# Patient Record
Sex: Female | Born: 1960
Health system: Southern US, Community
[De-identification: ages and names within clinical notes are randomized; demographics above are authoritative.]

## PROBLEM LIST (undated history)

## (undated) DIAGNOSIS — B373 Candidiasis of vulva and vagina: Secondary | ICD-10-CM

## (undated) DIAGNOSIS — R309 Painful micturition, unspecified: Secondary | ICD-10-CM

## (undated) DIAGNOSIS — R319 Hematuria, unspecified: Secondary | ICD-10-CM

## (undated) DIAGNOSIS — Z780 Asymptomatic menopausal state: Secondary | ICD-10-CM

## (undated) DIAGNOSIS — D219 Benign neoplasm of connective and other soft tissue, unspecified: Secondary | ICD-10-CM

## (undated) DIAGNOSIS — N898 Other specified noninflammatory disorders of vagina: Secondary | ICD-10-CM

## (undated) DIAGNOSIS — N760 Acute vaginitis: Secondary | ICD-10-CM

## (undated) DIAGNOSIS — I10 Essential (primary) hypertension: Secondary | ICD-10-CM

## (undated) DIAGNOSIS — B9689 Other specified bacterial agents as the cause of diseases classified elsewhere: Secondary | ICD-10-CM

## (undated) DIAGNOSIS — N816 Rectocele: Secondary | ICD-10-CM

## (undated) HISTORY — DX: Essential (primary) hypertension: I10

## (undated) HISTORY — DX: Painful micturition, unspecified: R30.9

## (undated) HISTORY — DX: Other specified bacterial agents as the cause of diseases classified elsewhere: B96.89

## (undated) HISTORY — DX: Other specified noninflammatory disorders of vagina: N89.8

## (undated) HISTORY — DX: Benign neoplasm of connective and other soft tissue, unspecified: D21.9

## (undated) HISTORY — DX: Other specified bacterial agents as the cause of diseases classified elsewhere: N76.0

## (undated) HISTORY — DX: Rectocele: N81.6

## (undated) HISTORY — PX: OTHER SURGICAL HISTORY: SHX169

## (undated) HISTORY — PX: NO PAST SURGERIES: SHX2092

## (undated) HISTORY — DX: Asymptomatic menopausal state: Z78.0

## (undated) HISTORY — DX: Candidiasis of vulva and vagina: B37.3

## (undated) HISTORY — DX: Hematuria, unspecified: R31.9

---

## 2001-10-12 ENCOUNTER — Other Ambulatory Visit: Admission: RE | Admit: 2001-10-12 | Discharge: 2001-10-12 | Payer: Self-pay | Admitting: Family Medicine

## 2001-11-09 ENCOUNTER — Ambulatory Visit (HOSPITAL_COMMUNITY): Admission: RE | Admit: 2001-11-09 | Discharge: 2001-11-09 | Payer: Self-pay | Admitting: Family Medicine

## 2001-11-09 ENCOUNTER — Encounter: Payer: Self-pay | Admitting: Family Medicine

## 2002-11-09 ENCOUNTER — Encounter: Payer: Self-pay | Admitting: Family Medicine

## 2002-11-09 ENCOUNTER — Ambulatory Visit (HOSPITAL_COMMUNITY): Admission: RE | Admit: 2002-11-09 | Discharge: 2002-11-09 | Payer: Self-pay | Admitting: Family Medicine

## 2003-09-30 ENCOUNTER — Emergency Department (HOSPITAL_COMMUNITY): Admission: EM | Admit: 2003-09-30 | Discharge: 2003-09-30 | Payer: Self-pay | Admitting: Emergency Medicine

## 2003-11-14 ENCOUNTER — Ambulatory Visit (HOSPITAL_COMMUNITY): Admission: RE | Admit: 2003-11-14 | Discharge: 2003-11-14 | Payer: Self-pay | Admitting: Family Medicine

## 2004-11-20 ENCOUNTER — Ambulatory Visit (HOSPITAL_COMMUNITY): Admission: RE | Admit: 2004-11-20 | Discharge: 2004-11-20 | Payer: Self-pay | Admitting: Family Medicine

## 2005-11-26 ENCOUNTER — Ambulatory Visit (HOSPITAL_COMMUNITY): Admission: RE | Admit: 2005-11-26 | Discharge: 2005-11-26 | Payer: Self-pay | Admitting: Family Medicine

## 2006-07-24 ENCOUNTER — Ambulatory Visit: Payer: Self-pay | Admitting: Internal Medicine

## 2006-07-24 LAB — CONVERTED CEMR LAB
BUN: 11 mg/dL
Calcium: 9.3 mg/dL
Cholesterol: 150 mg/dL
Glucose, Bld: 75 mg/dL
LDL Cholesterol: 67 mg/dL
Sodium: 138 meq/L
Triglycerides: 112 mg/dL

## 2006-09-25 ENCOUNTER — Ambulatory Visit: Payer: Self-pay | Admitting: Internal Medicine

## 2006-09-25 DIAGNOSIS — I10 Essential (primary) hypertension: Secondary | ICD-10-CM | POA: Insufficient documentation

## 2006-12-09 ENCOUNTER — Ambulatory Visit (HOSPITAL_COMMUNITY): Admission: RE | Admit: 2006-12-09 | Discharge: 2006-12-09 | Payer: Self-pay | Admitting: Obstetrics and Gynecology

## 2007-01-27 ENCOUNTER — Ambulatory Visit: Payer: Self-pay | Admitting: Internal Medicine

## 2007-01-27 DIAGNOSIS — M549 Dorsalgia, unspecified: Secondary | ICD-10-CM | POA: Insufficient documentation

## 2007-06-19 ENCOUNTER — Ambulatory Visit: Payer: Self-pay | Admitting: Internal Medicine

## 2007-06-19 DIAGNOSIS — E663 Overweight: Secondary | ICD-10-CM | POA: Insufficient documentation

## 2007-12-09 ENCOUNTER — Other Ambulatory Visit: Admission: RE | Admit: 2007-12-09 | Discharge: 2007-12-09 | Payer: Self-pay | Admitting: Obstetrics and Gynecology

## 2007-12-14 ENCOUNTER — Ambulatory Visit (HOSPITAL_COMMUNITY): Admission: RE | Admit: 2007-12-14 | Discharge: 2007-12-14 | Payer: Self-pay | Admitting: Obstetrics and Gynecology

## 2008-05-13 ENCOUNTER — Ambulatory Visit: Payer: Self-pay | Admitting: Internal Medicine

## 2008-05-13 DIAGNOSIS — R51 Headache: Secondary | ICD-10-CM | POA: Insufficient documentation

## 2008-05-13 DIAGNOSIS — R519 Headache, unspecified: Secondary | ICD-10-CM | POA: Insufficient documentation

## 2008-09-29 ENCOUNTER — Ambulatory Visit (HOSPITAL_COMMUNITY): Admission: RE | Admit: 2008-09-29 | Discharge: 2008-09-29 | Payer: Self-pay | Admitting: Obstetrics & Gynecology

## 2008-11-18 HISTORY — PX: COLONOSCOPY: SHX174

## 2008-12-14 ENCOUNTER — Ambulatory Visit (HOSPITAL_COMMUNITY): Admission: RE | Admit: 2008-12-14 | Discharge: 2008-12-14 | Payer: Self-pay | Admitting: Obstetrics and Gynecology

## 2008-12-28 ENCOUNTER — Other Ambulatory Visit: Admission: RE | Admit: 2008-12-28 | Discharge: 2008-12-28 | Payer: Self-pay | Admitting: Obstetrics and Gynecology

## 2009-03-13 ENCOUNTER — Ambulatory Visit: Payer: Self-pay | Admitting: Family Medicine

## 2009-03-13 DIAGNOSIS — N949 Unspecified condition associated with female genital organs and menstrual cycle: Secondary | ICD-10-CM

## 2009-03-13 DIAGNOSIS — N938 Other specified abnormal uterine and vaginal bleeding: Secondary | ICD-10-CM | POA: Insufficient documentation

## 2009-03-14 ENCOUNTER — Encounter (INDEPENDENT_AMBULATORY_CARE_PROVIDER_SITE_OTHER): Payer: Self-pay | Admitting: *Deleted

## 2009-04-18 ENCOUNTER — Ambulatory Visit: Payer: Self-pay | Admitting: Gastroenterology

## 2009-04-18 DIAGNOSIS — K5909 Other constipation: Secondary | ICD-10-CM | POA: Insufficient documentation

## 2009-04-20 ENCOUNTER — Encounter: Payer: Self-pay | Admitting: Gastroenterology

## 2009-05-15 ENCOUNTER — Ambulatory Visit (HOSPITAL_COMMUNITY): Admission: RE | Admit: 2009-05-15 | Discharge: 2009-05-15 | Payer: Self-pay | Admitting: Gastroenterology

## 2009-05-15 ENCOUNTER — Ambulatory Visit: Payer: Self-pay | Admitting: Gastroenterology

## 2009-05-25 ENCOUNTER — Ambulatory Visit: Payer: Self-pay | Admitting: Family Medicine

## 2009-07-17 ENCOUNTER — Telehealth: Payer: Self-pay | Admitting: Family Medicine

## 2009-07-29 ENCOUNTER — Encounter: Payer: Self-pay | Admitting: Family Medicine

## 2009-07-29 LAB — CONVERTED CEMR LAB
BUN: 18 mg/dL (ref 6–23)
CO2: 23 meq/L (ref 19–32)
Chloride: 103 meq/L (ref 96–112)
Creatinine, Ser: 0.8 mg/dL (ref 0.40–1.20)
Eosinophils Absolute: 0.1 10*3/uL (ref 0.0–0.7)
Eosinophils Relative: 1 % (ref 0–5)
Glucose, Bld: 89 mg/dL (ref 70–99)
HCT: 39 % (ref 36.0–46.0)
HDL: 49 mg/dL (ref >39)
LDL Cholesterol: 90 mg/dL (ref 0–99)
Lymphs Abs: 2.1 10*3/uL (ref 0.7–4.0)
MCHC: 34.6 g/dL (ref 30.0–36.0)
MCV: 95.8 fL (ref 78.0–100.0)
Monocytes Absolute: 0.6 10*3/uL (ref 0.1–1.0)
Monocytes Relative: 11 % (ref 3–12)
RBC: 4.07 M/uL (ref 3.87–5.11)
TSH: 4.198 microintl units/mL (ref 0.350–4.500)
WBC: 5 10*3/uL (ref 4.0–10.5)

## 2009-08-08 ENCOUNTER — Ambulatory Visit: Payer: Self-pay | Admitting: Family Medicine

## 2009-08-21 ENCOUNTER — Encounter: Payer: Self-pay | Admitting: Orthopedic Surgery

## 2009-08-28 ENCOUNTER — Ambulatory Visit: Payer: Self-pay | Admitting: Orthopedic Surgery

## 2009-12-15 ENCOUNTER — Ambulatory Visit (HOSPITAL_COMMUNITY): Admission: RE | Admit: 2009-12-15 | Discharge: 2009-12-15 | Payer: Self-pay | Admitting: Family Medicine

## 2010-01-22 ENCOUNTER — Other Ambulatory Visit: Admission: RE | Admit: 2010-01-22 | Discharge: 2010-01-22 | Payer: Self-pay | Admitting: Obstetrics and Gynecology

## 2010-01-23 ENCOUNTER — Ambulatory Visit: Payer: Self-pay | Admitting: Family Medicine

## 2010-02-21 ENCOUNTER — Telehealth: Payer: Self-pay | Admitting: Family Medicine

## 2010-05-25 ENCOUNTER — Telehealth: Payer: Self-pay | Admitting: Family Medicine

## 2010-09-13 ENCOUNTER — Ambulatory Visit (HOSPITAL_COMMUNITY)
Admission: RE | Admit: 2010-09-13 | Discharge: 2010-09-13 | Payer: Self-pay | Source: Home / Self Care | Admitting: Obstetrics and Gynecology

## 2010-10-23 ENCOUNTER — Telehealth: Payer: Self-pay | Admitting: Family Medicine

## 2010-11-06 ENCOUNTER — Ambulatory Visit: Payer: Self-pay | Admitting: Family Medicine

## 2010-11-12 DIAGNOSIS — B369 Superficial mycosis, unspecified: Secondary | ICD-10-CM | POA: Insufficient documentation

## 2010-11-26 ENCOUNTER — Encounter: Payer: Self-pay | Admitting: Family Medicine

## 2010-11-27 DIAGNOSIS — E559 Vitamin D deficiency, unspecified: Secondary | ICD-10-CM | POA: Insufficient documentation

## 2010-11-27 LAB — CONVERTED CEMR LAB
Basophils Absolute: 0 10*3/uL (ref 0.0–0.1)
CO2: 25 meq/L (ref 19–32)
Chloride: 106 meq/L (ref 96–112)
Creatinine, Ser: 0.8 mg/dL (ref 0.40–1.20)
HDL: 49 mg/dL (ref >39)
Hemoglobin: 13 g/dL (ref 12.0–15.0)
LDL Cholesterol: 94 mg/dL (ref 0–99)
Lymphocytes Relative: 32 % (ref 12–46)
Monocytes Absolute: 0.6 10*3/uL (ref 0.1–1.0)
Neutro Abs: 4.2 10*3/uL (ref 1.7–7.7)
RBC: 4.01 M/uL (ref 3.87–5.11)
RDW: 12 % (ref 11.5–15.5)
TSH: 3.644 microintl units/mL (ref 0.350–4.500)
Total CHOL/HDL Ratio: 3.2
WBC: 7.2 10*3/uL (ref 4.0–10.5)

## 2010-12-17 ENCOUNTER — Ambulatory Visit (HOSPITAL_COMMUNITY)
Admission: RE | Admit: 2010-12-17 | Discharge: 2010-12-17 | Payer: Self-pay | Source: Home / Self Care | Attending: Family Medicine | Admitting: Family Medicine

## 2010-12-18 NOTE — Assessment & Plan Note (Signed)
Summary: office visit   Vital Signs:  Patient profile:   50 year old female Menstrual status:  regular Height:      65.5 inches Weight:      172.13 pounds BMI:     28.31 O2 Sat:      96 % Pulse rate:   62 / minute Pulse rhythm:   regular Resp:     16 per minute BP sitting:   118 / 80  (left arm) Cuff size:   large  Vitals Entered By: Everitt Amber LPN (January 24, 8468 9:24 AM)  Nutrition Counseling: Patient's BMI is greater than 25 and therefore counseled on weight management options. CC: Follow up chronic problems Is Patient Diabetic? No   Primary Care Provider:  Callie Bunyard,M.D.  CC:  Follow up chronic problems.  History of Present Illness: Reports  that she has been  doing well.Shwe ois concerned about her weight and states shereally has not been exercising as regularly as she should be becauseof time constraints, she intends however to change this. Denies recent fever or chills. Denies sinus pressure, nasal congestion , ear pain or sore throat. Denies chest congestion, or cough productive of sputum. Denies chest pain, palpitations, PND, orthopnea or leg swelling. Denies abdominal pain, nausea, vomitting, diarrhea or constipation. Denies change in bowel movements or bloody stool. Denies dysuria , frequency, incontinence or hesitancy. Denies  joint pain, swelling, or reduced mobility. Denies headaches, vertigo, seizures. Denies depression, anxiety or insomnia. Denies  rash, lesions, or itch.     Current Medications (verified): 1)  Loestrin Fe 1.5/30 1.5-30 Mg-Mcg Tabs (Norethin Ace-Eth Estrad-Fe) .... Take 1 Tablet By Mouth Once A Day 2)  Tylenol 325 Mg Tabs (Acetaminophen) .... As Needed 3)  Maxzide-25 37.5-25 Mg Tabs (Triamterene-Hctz) .... Take 1 Tablet By Mouth Once A Day  Allergies (verified): 1)  ! Amoxicillin 2)  ! Advil 3)  ! Ibuprofen  Past History:  Past medical, surgical, family and social histories (including risk factors) reviewed, and no changes noted  (except as noted below).  Past Medical History: Hypertension(diet controlled)  2001  overweight in the past 18 months, associaters it with shift change  Past Surgical History:  Ganglion cyst removed from right hand 2010, dr Romeo Apple  Family History: Reviewed history from 08/28/2009 and no changes required. No FH of Colon Cancer or polyps. father-deceased-MI age of first late 29's mother-deceased-CVA age 58 sister x1, hTN diagnosed in her 70's ,currently aged 94 brothers x3 one is hypertensive FH of Cancer:  Family History of Diabetes  Social History: Reviewed history from 08/28/2009 and no changes required. 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 Regular exercise-tries to walk 15 mins a day, longer if possible. No children  Review of Systems      See HPI Eyes:  Denies blurring and discharge. Neuro:  Denies headaches and tingling. Endo:  Denies cold intolerance, excessive hunger, excessive thirst, excessive urination, heat intolerance, polyuria, and weight change. Heme:  Denies abnormal bruising and bleeding. Allergy:  Denies hives or rash and sneezing.  Physical Exam  General:  alert, well-nourished, and well-hydrated. HEENT: No facial asymmetry,  EOMI, No sinus tenderness, TM's Clear, oropharynx  pink and moist.   Chest: Clear to auscultation bilaterally.  CVS: S1, S2, No murmurs, No S3.   Abd: Soft, Nontender.  MS: Adequate ROM spine, hips, shoulders and knees.  Ext: No edema.   CNS: CN 2-12 intact, power tone and sensation normal throughout.   Skin: Intact, no visible lesions or rashes. Ganglion cyst tender on right hand (dorsum) Psych: Good eye contact, normal affect.  Memory intact, not anxious or depressed appearing.      Impression & Recommendations:  Problem # 1:  OVERWEIGHT (ICD-278.02) Assessment  Deteriorated  Orders: T-Lipid Profile ([id number])  Ht: 65.5 (01/23/2010)   Wt: 172.13 (01/23/2010)   BMI: 28.31 (01/23/2010)  Problem # 2:  HYPERTENSION (ICD-401.9) Assessment: Unchanged  Her updated medication list for this problem includes:    Maxzide-25 37.5-25 Mg Tabs (Triamterene-hctz) .Marland Kitchen... Take 1 tablet by mouth once a day  Orders: T-Basic Metabolic Panel [id number])  BP today: 118/80 Prior BP: 106/80 (08/08/2009)  Labs Reviewed: K+: 3.9 (07/29/2009) Creat: : 0.80 (07/29/2009)   Chol: 151 (07/29/2009)   HDL: 49 (07/29/2009)   LDL: 90 (07/29/2009)   TG: 62 (07/29/2009)  Complete Medication List: 1)  Loestrin Fe 1.5/30 1.5-30 Mg-mcg Tabs (Norethin ace-eth estrad-fe) .... Take 1 tablet by mouth once a day 2)  Maxzide-25 37.5-25 Mg Tabs (Triamterene-hctz) .... Take 1 tablet by mouth once a day  Other Orders: T-CBC w/Diff ([id number]) T-Vitamin D (25-Hydroxy) [id number])  Patient Instructions: 1)  f/U in  Sept 15 or after  2)  It is important that you exercise regularly at least 20 minutes 5 times a week. If you develop chest pain, have severe difficulty breathing, or feel very tired , stop exercising immediately and seek medical attention. 3)  You need to lose weight. Consider a lower calorie diet and regular exercise.  4)  no med changes at this time. 5)  Congrats on upcoming marriage 6)  BMP prior to visit, ICD-9: 7)  Lipid Panel prior to visit, ICD-9: 8)  CBC w/ Diff prior to visit, ICD-9:  fasting sept 12 or after. 9)  Vit D level  Prescriptions: MAXZIDE-25 37.5-25 MG TABS (TRIAMTERENE-HCTZ) Take 1 tablet by mouth once a day  #30 x 5   Entered by:   Everitt Amber LPN   Authorized by:   Syliva Overman MD   Signed by:   Everitt Amber LPN on 28/41/3244   Method used:   Electronically to        [facility] (retail)       [address]       [place], [place]  [address]       Ph: [phone]       Fax: [phone]   RxID:    [id number]

## 2010-12-18 NOTE — Assessment & Plan Note (Signed)
Summary: Headache   Vital Signs:  Patient Profile:   50 Years Old Female Height:     66 inches O2 Sat:      100 % O2 treatment:    Room Air Pulse rate:   72 / minute Resp:     8 per minute BP sitting:   134 / 92  (right arm)  Vitals Entered By: Lutricia Horsfall (May 13, 2008 2:42 PM)                 Chief Complaint:  headaches x1 month.  History of Present Illness: Her complaining of headaches 2-3 times per week in the bilateral parietal area associated with blurry vision.  No nausea or vomiting.  She denies any aura.  She denies a history of headaches.  She has tried garlic tablets because she heard that was good for her blood pressure and she thought the headaches were related to blood pressure.  SHe denies nasal congestion, post nasal drip, sore throat or ear pain.  No history of trauma.  No numbness or weakness.  She says she checked her blood pressure at the pharmacy last week and it was 129/87 but she was not having a headache then.  She does complain of a 5/10 headache at this time.    Current Allergies: ! AMOXICILLIN  Past Medical History:    Reviewed history from 01/27/2007 and no changes required:       Hypertension(diet controlled)      Physical Exam  General:     alert, well-developed, and well-nourished.   Head:     normocephalic, atraumatic, and no abnormalities palpated.  Mild tenderness only in the b/l temporal area. Nose:     mild Erythema and swelling with clear drainage.  Mouth:     No eyrthema or exudate.  Neck:     supple and full ROM.  No lymphadenopathy, JVD, thyromegaly or carotid bruits.  Lungs:     Clear to auscultation bilaterally with good air movement, normal expansion.  Heart:     Normal S1 and S2. No murmurs or extracardiac sounds. PMI is nondisplaced.     Impression & Recommendations:  Problem # 1:  HEADACHE (ICD-784.0) We discussed possible work up and treatment options.  We are opting to be conservative and use Prodrin  as needed headache.  If she persists in having headaches weekly for the next month, we discussed getting a head CT for further evaluation.  I told her that her blood pressure is more likely mildly elevated today due to the headache and not the cause of the headache.   Her updated medication list for this problem includes:    Tylenol 325 Mg Tabs (Acetaminophen) .Marland Kitchen... As needed    Meloxicam 15 Mg Tabs (Meloxicam) .Marland Kitchen... 1 by mouth once daily    Prodrin 500-130-20 Mg Tabs (Apap-isometheptene-caffeine) .Marland Kitchen... 1 by mouth q 4 hours as needed headache   Complete Medication List: 1)  Nortrel 7/7/7 0.5/0.75/1-35 Mg-mcg Tabs (Norethin-eth estrad triphasic) .Marland Kitchen.. 1 by mouth once daily 2)  Tylenol 325 Mg Tabs (Acetaminophen) .... As needed 3)  Skelaxin 800 Mg Tabs (Metaxalone) .Marland Kitchen.. 1 by mouth three times a day 4)  Meloxicam 15 Mg Tabs (Meloxicam) .Marland Kitchen.. 1 by mouth once daily 5)  Prodrin 500-130-20 Mg Tabs (Apap-isometheptene-caffeine) .Marland Kitchen.. 1 by mouth q 4 hours as needed headache    Prescriptions: PRODRIN 500-130-20 MG  TABS (APAP-ISOMETHEPTENE-CAFFEINE) 1 by mouth q 4 hours as needed headache  #10 x 0   Entered  and Authorized by:   Erle Crocker MD   Signed by:   Erle Crocker MD on 05/13/2008   Method used:   Samples Given   RxID:   [id number]  ][Headache Q&E-CCC]

## 2010-12-18 NOTE — Progress Notes (Signed)
Summary: refill  Phone Note Call from Patient   Summary of Call: pt would like to get a refill on meds. [phone] Initial call taken by: Rudene Anda,  February 21, 2010 10:26 AM  Follow-up for Phone Call        Rx Called In Follow-up by: Adella Hare LPN,  February 21, 2010 11:21 AM    Prescriptions: MAXZIDE-25 37.5-25 MG TABS (TRIAMTERENE-HCTZ) Take 1 tablet by mouth once a day  #30 x 3   Entered by:   Adella Hare LPN   Authorized by:   Syliva Overman MD   Signed by:   Adella Hare LPN on 45/40/9811   Method used:   Electronically to        [facility] (retail)       [address]       [place], [place]  [address]       Ph: [phone]       Fax: [phone]   RxID:   [id number] LOESTRIN FE 1.5/30 1.5-30 MG-MCG TABS (NORETHIN ACE-ETH ESTRAD-FE) Take 1 tablet by mouth once a day  #30 x 3   Entered by:   Adella Hare LPN   Authorized by:   Syliva Overman MD   Signed by:   Adella Hare LPN on 66/44/0347   Method used:   Electronically to        [facility] (retail)       [address]       [place], [place]  [address]       Ph: [phone]       Fax: [phone]   RxID:   [id number]

## 2010-12-18 NOTE — Progress Notes (Signed)
Summary: BP MED  Phone Note Call from Patient   Summary of Call: [facility]  CALL BACK AT [phone] Initial call taken by: Lind Guest,  May 25, 2010 8:50 AM  Follow-up for Phone Call        plsrefill maxzide x 1 , needs ov Follow-up by: Syliva Overman MD,  May 25, 2010 12:46 PM  Additional Follow-up for Phone Call Additional follow up Details #1::        Rx Called In Additional Follow-up by: Adella Hare LPN,  May 26, 8755 3:28 PM    Prescriptions: MAXZIDE-25 37.5-25 MG TABS (TRIAMTERENE-HCTZ) Take 1 tablet by mouth once a day  #30 x 0   Entered by:   Adella Hare LPN   Authorized by:   Syliva Overman MD   Signed by:   Adella Hare LPN on 43/32/9518   Method used:   [facility]        [facility] [phone][facility] (retail)       [address]       [place], [place]  [address]       Ph: [phone] or [phone]       Fax: [phone]   RxID:   [id number] MAXZIDE-25 37.5-25 MG TABS (TRIAMTERENE-HCTZ) Take 1 tablet by mouth once a day  #30 x 0   Entered by:   Adella Hare LPN   Authorized by:   Syliva Overman MD   Signed by:   Adella Hare LPN on 94/85/4627   Method used:   Electronically to        [facility] (retail)       [address]       [place], [place]  [address]       Ph: [phone]       Fax: [phone]   RxID:   [id number]  [facility]

## 2010-12-18 NOTE — Progress Notes (Signed)
Summary: medication  Phone Note Call from Patient   Summary of Call: patient [facility], I requested patient to call pharmacy but she isn't sure which of her medications is for blood pressure and she said it started with an F, not one on her medication list. Initial call taken by: Curtis Sites,  October 23, 2010 9:53 AM  Follow-up for Phone Call        according to when this was last sent in, patient should have been out of meds two months ago patient needs an ov first and needs to bring meds with her Follow-up by: Adella Hare LPN,  October 24, 2010 12:56 PM  Additional Follow-up for Phone Call Additional follow up Details #1::        called and left msg for patient to return my call. Additional Follow-up by: Curtis Sites,  October 24, 2010 2:03 PM    Additional Follow-up for Phone Call Additional follow up Details #2::    called patient, left message Follow-up by: Adella Hare LPN,  October 25, 2010 12:55 PM  Additional Follow-up for Phone Call Additional follow up Details #3:: Details for Additional Follow-up Action Taken: patient advised to schedule appt Additional Follow-up by: Adella Hare LPN,  October 26, 2010 1:25 PM

## 2010-12-20 NOTE — Miscellaneous (Signed)
  Clinical Lists Changes  Medications: Added new medication of VITAMIN D (ERGOCALCIFEROL) 50000 UNIT CAPS (ERGOCALCIFEROL) one capsule once weekly - Signed Rx of VITAMIN D (ERGOCALCIFEROL) 50000 UNIT CAPS (ERGOCALCIFEROL) one capsule once weekly;  #4 x 4;  Signed;  Entered by: Syliva Overman MD;  Authorized by: Syliva Overman MD;  Method used: Historical    Prescriptions: VITAMIN D (ERGOCALCIFEROL) 50000 UNIT CAPS (ERGOCALCIFEROL) one capsule once weekly  #4 x 4   Entered and Authorized by:   Syliva Overman MD   Signed by:   Syliva Overman MD on 11/26/2010   Method used:   Historical   RxID:   [id number]

## 2010-12-20 NOTE — Assessment & Plan Note (Signed)
Summary: OV   Vital Signs:  Patient profile:   50 year old female Menstrual status:  regular Height:      65.5 inches Weight:      179 pounds BMI:     29.44 O2 Sat:      98 % on Room air Pulse rate:   74 / minute Pulse rhythm:   regular Resp:     16 per minute BP sitting:   110 / 70  (left arm)  Vitals Entered By: Adella Hare LPN (November 06, 2010 10:41 AM)  Nutrition Counseling: Patient's BMI is greater than 25 and therefore counseled on weight management options.  O2 Flow:  Room air CC: rash on left hand Is Patient Diabetic? No   Primary Care Daquarius Dubeau:  Simpson,M.D.  CC:  rash on left hand.  History of Present Illness: Reports  that she has been doing well. she is not execising like she had in the past, and has gained weight, she plans to address this.  Denies recent fever or chills. Denies sinus pressure, nasal congestion , ear pain or sore throat. Denies chest congestion, or cough productive of sputum. Denies chest pain, palpitations, PND, orthopnea or leg swelling. Denies abdominal pain, nausea, vomitting, diarrhea or constipation. Denies change in bowel movements or bloody stool. Denies dysuria , frequency, incontinence or hesitancy. Denies  joint pain, swelling, or reduced mobility. Denies headaches, vertigo, seizures. Denies depression, anxiety or insomnia.      Current Medications (verified): 1)  Loestrin Fe 1.5/30 1.5-30 Mg-Mcg Tabs (Norethin Ace-Eth Estrad-Fe) .... Take 1 Tablet By Mouth Once A Day 2)  Maxzide-25 37.5-25 Mg Tabs (Triamterene-Hctz) .... Take 1 Tablet By Mouth Once A Day  Allergies (verified): 1)  ! Amoxicillin 2)  ! Advil 3)  ! Ibuprofen  Past History:  Past surgical history reviewed for relevance to current acute and chronic problems.  Past Surgical History: Reviewed history from 01/23/2010 and no changes required.  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 Regular exercise-tries to walk 15 mins a day, longer if possible. No children  Review of Systems      See HPI Eyes:  Denies discharge, eye pain, and red eye. Derm:  Complains of itching, lesion(s), and rash; dorsum of left hand enlarging x 3 months. Endo:  Denies cold intolerance, excessive hunger, excessive thirst, excessive urination, and heat intolerance. Heme:  Denies abnormal bruising and bleeding. Allergy:  Complains of seasonal allergies; denies hives or rash and itching eyes.  Physical Exam  General:  Well-developed,well-nourished,in no acute distress; alert,appropriate and cooperative throughout examination HEENT: No facial asymmetry,  EOMI, No sinus tenderness, TM's Clear, oropharynx  pink and moist.   Chest: Clear to auscultation bilaterally.  CVS: S1, S2, No murmurs, No S3.   Abd: Soft, Nontender.  MS: Adequate ROM spine, hips, shoulders and knees.  Ext: No edema.   CNS: CN 2-12 intact, power tone and sensation normal throughout.   Skin: Intact,fungal infection on dorsum of hand Psych: Good eye contact, normal affect.  Memory intact, not anxious or depressed appearing.    Impression & Recommendations:  Problem # 1:  OVERWEIGHT (ICD-278.02) Assessment Deteriorated  Ht: 65.5 (11/06/2010)   Wt: 179 (11/06/2010)   BMI: 29.44 (11/06/2010) therapeutic lifestyle change discussed and encouraged  Problem # 2:  HYPERTENSION (ICD-401.9) Assessment: Improved  Her updated medication list for this problem includes:    Maxzide-25 37.5-25 Mg Tabs (Triamterene-hctz) .Marland Kitchen... Take 1 tablet by mouth once a day  BP today: 110/70 Prior BP: 118/80 (01/23/2010)  Labs Reviewed: K+: 3.9 (07/29/2009) Creat: : 0.80 (07/29/2009)   Chol: 151 (07/29/2009)   HDL: 49 (07/29/2009)   LDL: 90 (07/29/2009)   TG: 62  (07/29/2009)  Problem # 3:  DERMATOMYCOSIS (ICD-111.9) Assessment: Comment Only  Her updated medication list for this problem includes:    Clotrimazole-betamethasone 1-0.05 % Crea (Clotrimazole-betamethasone) .Marland Kitchen... Apply to affected area twice daily for 2 weeks , then as needed  Complete Medication List: 1)  Loestrin Fe 1.5/30 1.5-30 Mg-mcg Tabs (Norethin ace-eth estrad-fe) .... Take 1 tablet by mouth once a day 2)  Maxzide-25 37.5-25 Mg Tabs (Triamterene-hctz) .... Take 1 tablet by mouth once a day 3)  Clotrimazole-betamethasone 1-0.05 % Crea (Clotrimazole-betamethasone) .... Apply to affected area twice daily for 2 weeks , then as needed  Other Orders: T-Basic Metabolic Panel [id number]) T-CBC w/Diff [id number]) T-Lipid Profile [id number]) T-TSH [id number]) T-Vitamin D (25-Hydroxy) [id number])  Patient Instructions: 1)  Follow up appointment in 5.77months 2)  It is important that you exercise regularly at least 20 minutes 5 times a week. If you develop chest pain, have severe difficulty breathing, or feel very tired , stop exercising immediately and seek medical attention. 3)  You need to lose weight. Consider a lower calorie diet and regular exercise.aim is 8 to pounds  4)  BMP prior to visit, ICD-9: 5)  Lipid Panel prior to visit, ICD-9: 6)  TSH prior to visit, ICD-9:   fasting asap 7)  CBC w/ Diff prior to visit, ICD-9: 8)  vit D 9)  Schedule your mammogram. 10)  You need to have a Pap Smear to prevent cervical cancer. 11)  Med is sent in for rash which I believe is partly fungal and partly eczema 12)  BOP is great Prescriptions: MAXZIDE-25 37.5-25 MG TABS (TRIAMTERENE-HCTZ) Take 1 tablet by mouth once a day  #30 x 5   Entered by:   Adella Hare LPN   Authorized by:   Syliva Overman MD   Signed by:   Adella Hare LPN on 64/40/3474   Method used:   [facility]        [facility] [phone][facility] (retail)       [address]       [place], [place]  [address]       Ph: [phone] or [phone]       Fax: [phone]   RxID:   [id number] CLOTRIMAZOLE-BETAMETHASONE 1-0.05 % CREA (CLOTRIMAZOLE-BETAMETHASONE) apply to affected area twice daily for 2 weeks , then as needed  #45 gm x 0   Entered and Authorized by:   Syliva Overman MD   Signed by:   Syliva Overman MD on 11/06/2010   Method used:   [facility]        [facility] [phone][facility] (retail)       [address]       [place], [place]  [address]       Ph: [phone] or [phone]       Fax: [phone]   RxID:   [id number]    Orders Added: 1)  Est. Patient Level IV [10258] 2)  T-Basic Metabolic Panel [[id number]] 3)  T-CBC w/Diff [[id number]] 4)  T-Lipid  Profile [[id number]] 5)  T-TSH [[id number]] 6)  T-Vitamin D (25-Hydroxy) [phone]

## 2011-01-30 ENCOUNTER — Encounter: Payer: Self-pay | Admitting: Family Medicine

## 2011-01-30 ENCOUNTER — Ambulatory Visit (INDEPENDENT_AMBULATORY_CARE_PROVIDER_SITE_OTHER): Payer: 59 | Admitting: Family Medicine

## 2011-01-30 DIAGNOSIS — E559 Vitamin D deficiency, unspecified: Secondary | ICD-10-CM

## 2011-01-30 DIAGNOSIS — L259 Unspecified contact dermatitis, unspecified cause: Secondary | ICD-10-CM

## 2011-01-30 DIAGNOSIS — I10 Essential (primary) hypertension: Secondary | ICD-10-CM

## 2011-01-30 DIAGNOSIS — E663 Overweight: Secondary | ICD-10-CM

## 2011-02-04 DIAGNOSIS — R059 Cough, unspecified: Secondary | ICD-10-CM | POA: Insufficient documentation

## 2011-02-04 DIAGNOSIS — R05 Cough: Secondary | ICD-10-CM | POA: Insufficient documentation

## 2011-02-08 ENCOUNTER — Other Ambulatory Visit (HOSPITAL_COMMUNITY)
Admission: RE | Admit: 2011-02-08 | Discharge: 2011-02-08 | Disposition: A | Discharge status: Home / Self Care | Payer: 59 | Source: Ambulatory Visit | Attending: Obstetrics and Gynecology | Admitting: Obstetrics and Gynecology

## 2011-02-08 ENCOUNTER — Other Ambulatory Visit: Payer: Self-pay | Admitting: Adult Health

## 2011-02-08 DIAGNOSIS — Z01419 Encounter for gynecological examination (general) (routine) without abnormal findings: Secondary | ICD-10-CM | POA: Insufficient documentation

## 2011-02-14 NOTE — Assessment & Plan Note (Signed)
Summary: office visit   Vital Signs:  Patient profile:   50 year old female Menstrual status:  regular Height:      65.5 inches Weight:      177.50 pounds BMI:     29.19 O2 Sat:      98 % Pulse rate:   70 / minute Pulse rhythm:   regular Resp:     16 per minute BP sitting:   114 / 84  (left arm) Cuff size:   large  Vitals Entered By: Everitt Amber LPN (January 30, 2011 2:34 PM)  Nutrition Counseling: Patient's BMI is greater than 25 and therefore counseled on weight management options. CC: c/o rash on left arm and it has been spreading. The prescription cream has not helped it much. Also nasal congestion, was yellowidh but now its almost clear x 1 week    Primary Care Provider:  Simpson,M.D.  CC:  c/o rash on left arm and it has been spreading. The prescription cream has not helped it much. Also nasal congestion and was yellowidh but now its almost clear x 1 week .  History of Present Illness: Head and chest congestion since 1 week ago, taking dAYQUIL AND NYQUIL WITH SOME RELIEF Reports  that she is otherwise doing well.  Denies recent fever or chills. Denies sinus pressure, nasal congestion , ear pain or sore throat. Denies chest congestion, or cough productive of sputum. Denies chest pain, palpitations, PND, orthopnea or leg swelling. Denies abdominal pain, nausea, vomitting, diarrhea or constipation. Denies change in bowel movements or bloody stool. Denies dysuria , frequency, incontinence or hesitancy. Denies  joint pain, swelling, or reduced mobility. Denies headaches, vertigo, seizures. Denies depression, anxiety or insomnia. Rash on left hand is worsening an spreading.   Current Medications (verified): 1)  Loestrin Fe 1.5/30 1.5-30 Mg-Mcg Tabs (Norethin Ace-Eth Estrad-Fe) .... Take 1 Tablet By Mouth Once A Day 2)  Maxzide-25 37.5-25 Mg Tabs (Triamterene-Hctz) .... Take 1 Tablet By Mouth Once A Day 3)  Clotrimazole-Betamethasone 1-0.05 % Crea  (Clotrimazole-Betamethasone) .... Apply To Affected Area Twice Daily For 2 Weeks , Then As Needed 4)  Vitamin D (Ergocalciferol) 50000 Unit Caps (Ergocalciferol) .... One Capsule Once Weekly 5)  Vitamin B-12 500 Mcg Tabs (Cyanocobalamin) .... Take 1 Tablet By Mouth Once A Day 6)  Vitamin E 200 Unit Caps (Vitamin E) .... Take 1 Tablet By Mouth Once A Day 7)  Rhodiola 300 Mg Caps (Rhodiola Rosea) .... One Tab Evey Other Day  Allergies (verified): 1)  ! Amoxicillin 2)  ! Advil 3)  ! Ibuprofen  Review of Systems      See HPI Eyes:  Denies blurring and discharge. Derm:  Complains of lesion(s) and rash; 5 month h/o red rash non puritic on left forearm which has worsened, clotrim/betameth has not helped, ocurs in patches,. no drainage. Endo:  Denies cold intolerance, excessive hunger, excessive thirst, and excessive urination. Heme:  Denies abnormal bruising and bleeding. Allergy:  Denies hives or rash and persistent infections.  Physical Exam  General:  Well-developed,well-nourished,in no acute distress; alert,appropriate and cooperative throughout examination HEENT: No facial asymmetry,  EOMI, No sinus tenderness, TM's Clear, oropharynx  pink and moist.   Chest: Clear to auscultation bilaterally.  CVS: S1, S2, No murmurs, No S3.   Abd: Soft, Nontender.  MS: Adequate ROM spine, hips, shoulders and knees.  Ext: No edema.   CNS: CN 2-12 intact, power tone and sensation normal throughout.   Skin: Intact,erythematous maculopapular rash in rings  3 separate areas Psych: Good eye contact, normal affect.  Memory intact, not anxious or depressed appearing.    Impression & Recommendations:  Problem # 1:  DERMATITIS (ICD-692.9) Assessment Deteriorated  Future Orders: Dermatology Referral (Derma) ... 01/31/2011  Problem # 2:  DERMATOMYCOSIS (ICD-111.9) Assessment: Deteriorated  Her updated medication list for this problem includes:    Clotrimazole-betamethasone 1-0.05 % Crea  (Clotrimazole-betamethasone) .Marland Kitchen... Apply to affected area twice daily for 2 weeks , then as needed  Take medication as directed for full duration.   Problem # 3:  UNSPECIFIED VITAMIN D DEFICIENCY (ICD-268.9) Assessment: Improved  Orders: T-Vitamin D (25-Hydroxy) ([id number])  Problem # 4:  HYPERTENSION (ICD-401.9) Assessment: Unchanged  Her updated medication list for this problem includes:    Maxzide-25 37.5-25 Mg Tabs (Triamterene-hctz) .Marland Kitchen... Take 1 tablet by mouth once a day  BP today: 114/84 Prior BP: 110/70 (11/06/2010)  Labs Reviewed: K+: 4.1 (11/20/2010) Creat: : 0.80 (11/20/2010)   Chol: 159 (11/20/2010)   HDL: 49 (11/20/2010)   LDL: 94 (11/20/2010)   TG: 82 (11/20/2010)  Problem # 5:  COUGH (ICD-786.2) Assessment: Improved tessalon perles prescribed   Complete Medication List: 1)  Loestrin Fe 1.5/30 1.5-30 Mg-mcg Tabs (Norethin ace-eth estrad-fe) .... Take 1 tablet by mouth once a day 2)  Maxzide-25 37.5-25 Mg Tabs (Triamterene-hctz) .... Take 1 tablet by mouth once a day 3)  Clotrimazole-betamethasone 1-0.05 % Crea (Clotrimazole-betamethasone) .... Apply to affected area twice daily for 2 weeks , then as needed 4)  Vitamin D (ergocalciferol) 50000 Unit Caps (Ergocalciferol) .... One capsule once weekly 5)  Vitamin B-12 500 Mcg Tabs (Cyanocobalamin) .... Take 1 tablet by mouth once a day 6)  Vitamin E 200 Unit Caps (Vitamin e) .... Take 1 tablet by mouth once a day 7)  Rhodiola 300 Mg Caps (Rhodiola rosea) .... One tab evey other day 8)  Tessalon Perles 100 Mg Caps (Benzonatate) .... Take 1 capsule by mouth three times a day for1 week, then as needed forcough and chest congestion  Other Orders: T- Hemoglobin A1C ([id number])  Patient Instructions: 1)  Follow up appointment in 5months 2)  It is important that you exercise regularly at least 30 minutes 6 times a week. If you develop chest pain, have severe difficulty breathing, or feel very tired , stop exercising  immediately and seek medical attention. 3)  You need to lose weight. Consider a lower calorie diet and regular exercise. Congrats on wight loss, goal is at least 2 more pounds 4)  HbgA1C prior to visit, ICD-9:  non fasting in 3 months 5)  and Vit D 6)  Pls continue vit D capsules for another 3 months at least, the test result will determine if you need it for longer. 7)  Pls start oNE multivitamin once daily of your choice, ensure it has vit D in it prferably 200IU in the tablet 8)  decongestant capsules are sent in 9)  You are being referred to dermatology Prescriptions: TESSALON PERLES 100 MG CAPS (BENZONATATE) Take 1 capsule by mouth three times a day for1 week, then as needed forcough and chest congestion  #42 x 0   Entered and Authorized by:   Syliva Overman MD   Signed by:   Syliva Overman MD on 01/30/2011   Method used:   Electronically to        [facility] [phone]* (retail)       [address]       [place]  [place], [place]  [address]       Ph: [phone]       Fax: [phone]   RxID:   [id number]    Orders Added: 1)  Est. Patient Level IV [30160] 2)  T- Hemoglobin A1C [83036-23375] 3)  T-Vitamin D (25-Hydroxy) [[id number]] 4)  Dermatology Referral [Derma]

## 2011-04-02 NOTE — Op Note (Signed)
[name], [name]               ACCOUNT NO.:  [id number]   MEDICAL RECORD NO.:  [id number]          PATIENT TYPE:  AMB   LOCATION:  DAY                           FACILITY:  [facility]   PHYSICIAN:  Kassie Mends, M.D.      DATE OF BIRTH:  March 02, 1961   DATE OF PROCEDURE:  05/15/2009  DATE OF DISCHARGE:                               OPERATIVE REPORT   REFERRING PHYSICIAN:  Milus Mallick. Lodema Hong, MD   PROCEDURE:  Colonoscopy.   INDICATION FOR EXAM:  [name] is a 50 year old female, who presents  with a change in her bowel habits.   FINDINGS:  1. Normal colon without evidence of polyps, masses, rare sigmoid colon      diverticula.  Otherwise, no polyps, masses, inflammatory changes,      or AVMs seen.  2. Small internal hemorrhoids.  Otherwise, normal retroflexed view of      the rectum.   RECOMMENDATIONS:  1. Screening colonoscopy in 10 years.  2. She should follow a high-fiber diet.  She is given a handout on      high-fiber diet, constipation, diverticulosis, and hemorrhoids.   MEDICATIONS:  1. Demerol 75 mg IV.  2. Versed 4 mg IV.   PROCEDURE TECHNIQUE:  Physical exam was performed.  Informed consent was  obtained from the patient after explaining the benefits, risks, and  alternatives to the procedure.  The patient was connected to the monitor  and placed in left lateral position.  Continuous oxygen was provided by  nasal cannula and IV medicine administered through an indwelling  cannula.  After administration of sedation and rectal exam, the  patient's rectum was intubated  and the scope was advanced under direct visualization to the cecum.  The  scope was removed slowly by carefully examining the color, texture,  anatomy, and integrity of the mucosa on the way out.  The patient was  recovered in endoscopy and discharged home in satisfactory condition.      Kassie Mends, M.D.  Electronically Signed     SM/MEDQ  D:  05/15/2009  T:  05/15/2009  Job:  [id number]   cc:    Milus Mallick. Lodema Hong, M.D.  Fax: [phone]

## 2011-04-26 ENCOUNTER — Encounter: Payer: Self-pay | Admitting: Family Medicine

## 2011-04-30 ENCOUNTER — Ambulatory Visit: Payer: Self-pay | Admitting: Family Medicine

## 2011-06-28 ENCOUNTER — Encounter: Payer: Self-pay | Admitting: Family Medicine

## 2011-07-02 ENCOUNTER — Ambulatory Visit: Payer: 59 | Admitting: Family Medicine

## 2011-08-04 ENCOUNTER — Other Ambulatory Visit: Payer: Self-pay | Admitting: Family Medicine

## 2011-11-22 ENCOUNTER — Other Ambulatory Visit: Payer: Self-pay | Admitting: Family Medicine

## 2011-11-27 ENCOUNTER — Other Ambulatory Visit: Payer: Self-pay | Admitting: Family Medicine

## 2011-11-27 ENCOUNTER — Telehealth: Payer: Self-pay | Admitting: Family Medicine

## 2011-11-27 DIAGNOSIS — Z139 Encounter for screening, unspecified: Secondary | ICD-10-CM

## 2011-11-28 MED ORDER — TRIAMTERENE-HCTZ 37.5-25 MG PO TABS: 1.0000 | ORAL_TABLET | Freq: Every day | ORAL | Status: DC

## 2011-11-28 NOTE — Telephone Encounter (Signed)
Only 30 days sent in. Must come to appt to get any more refills

## 2011-12-11 ENCOUNTER — Encounter: Payer: Self-pay | Admitting: Family Medicine

## 2011-12-13 ENCOUNTER — Encounter: Payer: Self-pay | Admitting: Family Medicine

## 2011-12-16 ENCOUNTER — Encounter: Payer: Self-pay | Admitting: Family Medicine

## 2011-12-16 ENCOUNTER — Ambulatory Visit (INDEPENDENT_AMBULATORY_CARE_PROVIDER_SITE_OTHER): Payer: BC Managed Care – PPO | Admitting: Family Medicine

## 2011-12-16 DIAGNOSIS — R7301 Impaired fasting glucose: Secondary | ICD-10-CM

## 2011-12-16 DIAGNOSIS — N949 Unspecified condition associated with female genital organs and menstrual cycle: Secondary | ICD-10-CM

## 2011-12-16 DIAGNOSIS — E663 Overweight: Secondary | ICD-10-CM

## 2011-12-16 DIAGNOSIS — Z139 Encounter for screening, unspecified: Secondary | ICD-10-CM

## 2011-12-16 DIAGNOSIS — R5381 Other malaise: Secondary | ICD-10-CM

## 2011-12-16 DIAGNOSIS — N938 Other specified abnormal uterine and vaginal bleeding: Secondary | ICD-10-CM

## 2011-12-16 DIAGNOSIS — N926 Irregular menstruation, unspecified: Secondary | ICD-10-CM

## 2011-12-16 DIAGNOSIS — E559 Vitamin D deficiency, unspecified: Secondary | ICD-10-CM

## 2011-12-16 DIAGNOSIS — I10 Essential (primary) hypertension: Secondary | ICD-10-CM

## 2011-12-16 MED ORDER — TRIAMTERENE-HCTZ 37.5-25 MG PO TABS: 1.0000 | ORAL_TABLET | Freq: Every day | ORAL | Status: DC

## 2011-12-16 NOTE — Assessment & Plan Note (Signed)
Deteriorated. Patient re-educated about  the importance of commitment to a  minimum of 150 minutes of exercise per week. The importance of healthy food choices with portion control discussed. Encouraged to start a food diary, count calories and to consider  joining a support group. Sample diet sheets offered. Goals set by the patient for the next several months.    

## 2011-12-16 NOTE — Assessment & Plan Note (Signed)
Floods and clots for 2 to 3 days per cycle

## 2011-12-16 NOTE — Assessment & Plan Note (Signed)
Controlled, no change in medication  

## 2011-12-16 NOTE — Assessment & Plan Note (Signed)
Will rept lab

## 2011-12-16 NOTE — Patient Instructions (Addendum)
F/U in 6 month  A healthy diet is rich in fruit, vegetables and whole grains. Poultry fish, nuts and beans are a healthy choice for protein rather then red meat. A low sodium diet and drinking 64 ounces of water daily is generally recommended. Oils and sweet should be limited. Carbohydrates especially for those who are diabetic or overweight, should be limited to 34-45 gram per meal. It is important to eat on a regular schedule, at least 3 times daily. Snacks should be primarily fruits, vegetables or nuts.   It is important that you exercise regularly at least 60 minutes 5 times a week. If you develop chest pain, have severe difficulty breathing, or feel very tired, stop exercising immediately and seek medical attention   Weight loss goal of 6 to 10 pounds in 6 months  Pls log into "my fittness pal"  Counts your calories , therefore helps with weight loss  Fasting cbc, chem 7, HBa1c, lipid, TSH and Vit D asap

## 2011-12-20 NOTE — Progress Notes (Signed)
  Subjective:    Patient ID: Caitlyn Patterson, female    DOB: Jun 24, 1961, 51 y.o.   MRN: [id number]  HPI The PT is here for follow up and re-evaluation of chronic medical conditions, medication management and review of any available recent lab and radiology data.  Preventive health is updated, specifically  Cancer screening and Immunization.   Questions or concerns regarding consultations or procedures which the PT has had in the interim are  addressed. The PT denies any adverse reactions to current medications since the last visit.  She is concerned about weight gain, and has not been exercising as before, will resume this  There are no specific complaints       Review of Systems See HPI Denies recent fever or chills. Denies sinus pressure, nasal congestion, ear pain or sore throat. Denies chest congestion, productive cough or wheezing. Denies chest pains, palpitations and leg swelling Denies abdominal pain, nausea, vomiting,diarrhea or constipation.   Denies dysuria, frequency, hesitancy or incontinence. Denies joint pain, swelling and limitation in mobility. Denies headaches, seizures, numbness, or tingling. Denies depression, anxiety or insomnia. Denies skin break down or rash.        Objective:   Physical Exam   Patient alert and oriented and in no cardiopulmonary distress.  HEENT: No facial asymmetry, EOMI, no sinus tenderness,  oropharynx pink and moist.  Neck supple no adenopathy.  Chest: Clear to auscultation bilaterally.  CVS: S1, S2 no murmurs, no S3.  ABD: Soft non tender. Bowel sounds normal.  Ext: No edema  MS: Adequate ROM spine, shoulders, hips and knees.  Skin: Intact, no ulcerations or rash noted.  Psych: Good eye contact, normal affect. Memory intact not anxious or depressed appearing.  CNS: CN 2-12 intact, power, tone and sensation normal throughout.      Assessment & Plan:

## 2011-12-21 LAB — CBC WITH DIFFERENTIAL/PLATELET
Basophils Absolute: 0 10*3/uL (ref 0.0–0.1)
Eosinophils Absolute: 0.1 10*3/uL (ref 0.0–0.7)
Eosinophils Relative: 2 % (ref 0–5)
MCH: 33.8 pg (ref 26.0–34.0)
MCHC: 34.6 g/dL (ref 30.0–36.0)
MCV: 97.7 fL (ref 78.0–100.0)
Platelets: 212 10*3/uL (ref 150–400)
RDW: 12 % (ref 11.5–15.5)

## 2011-12-21 LAB — HEMOGLOBIN A1C
Hgb A1c MFr Bld: 5.4 % (ref <5.7)
Mean Plasma Glucose: 108 mg/dL (ref <117)

## 2011-12-22 LAB — LIPID PANEL: Total CHOL/HDL Ratio: 3.7 Ratio

## 2011-12-22 LAB — BASIC METABOLIC PANEL
BUN: 13 mg/dL (ref 6–23)
Creat: 0.83 mg/dL (ref 0.50–1.10)
Potassium: 4.5 mEq/L (ref 3.5–5.3)

## 2011-12-24 ENCOUNTER — Other Ambulatory Visit: Payer: Self-pay | Admitting: Family Medicine

## 2011-12-24 MED ORDER — VITAMIN D3 1.25 MG (50000 UT) PO CAPS: 50000.0000 [IU] | ORAL_CAPSULE | ORAL | Status: DC

## 2012-01-06 ENCOUNTER — Ambulatory Visit (HOSPITAL_COMMUNITY)
Admission: RE | Admit: 2012-01-06 | Discharge: 2012-01-06 | Disposition: A | Discharge status: Home / Self Care | Payer: BC Managed Care – PPO | Source: Ambulatory Visit | Attending: Family Medicine | Admitting: Family Medicine

## 2012-01-06 ENCOUNTER — Ambulatory Visit (HOSPITAL_COMMUNITY): Payer: BC Managed Care – PPO

## 2012-01-06 DIAGNOSIS — Z139 Encounter for screening, unspecified: Secondary | ICD-10-CM

## 2012-01-06 DIAGNOSIS — Z1231 Encounter for screening mammogram for malignant neoplasm of breast: Secondary | ICD-10-CM | POA: Insufficient documentation

## 2012-02-24 ENCOUNTER — Other Ambulatory Visit: Payer: Self-pay | Admitting: Adult Health

## 2012-02-24 ENCOUNTER — Other Ambulatory Visit (HOSPITAL_COMMUNITY)
Admission: RE | Admit: 2012-02-24 | Discharge: 2012-02-24 | Disposition: A | Discharge status: Home / Self Care | Payer: BC Managed Care – PPO | Source: Ambulatory Visit | Attending: Obstetrics and Gynecology | Admitting: Obstetrics and Gynecology

## 2012-02-24 DIAGNOSIS — Z01419 Encounter for gynecological examination (general) (routine) without abnormal findings: Secondary | ICD-10-CM | POA: Insufficient documentation

## 2012-08-27 ENCOUNTER — Other Ambulatory Visit: Payer: Self-pay | Admitting: Family Medicine

## 2012-10-17 ENCOUNTER — Other Ambulatory Visit: Payer: Self-pay | Admitting: Family Medicine

## 2012-11-24 ENCOUNTER — Other Ambulatory Visit: Payer: Self-pay | Admitting: Family Medicine

## 2012-12-28 ENCOUNTER — Other Ambulatory Visit: Payer: Self-pay | Admitting: Family Medicine

## 2012-12-28 DIAGNOSIS — Z139 Encounter for screening, unspecified: Secondary | ICD-10-CM

## 2012-12-29 ENCOUNTER — Other Ambulatory Visit: Payer: Self-pay | Admitting: Family Medicine

## 2013-01-11 ENCOUNTER — Encounter: Payer: Self-pay | Admitting: Family Medicine

## 2013-01-11 ENCOUNTER — Ambulatory Visit (HOSPITAL_COMMUNITY)
Admission: RE | Admit: 2013-01-11 | Discharge: 2013-01-11 | Disposition: A | Discharge status: Home / Self Care | Payer: BC Managed Care – PPO | Source: Ambulatory Visit | Attending: Family Medicine | Admitting: Family Medicine

## 2013-01-11 ENCOUNTER — Ambulatory Visit (INDEPENDENT_AMBULATORY_CARE_PROVIDER_SITE_OTHER): Payer: BC Managed Care – PPO | Admitting: Family Medicine

## 2013-01-11 VITALS — BP 120/82 | HR 81 | Resp 16 | Ht 65.5 in | Wt 190.1 lb

## 2013-01-11 DIAGNOSIS — E559 Vitamin D deficiency, unspecified: Secondary | ICD-10-CM

## 2013-01-11 DIAGNOSIS — Z139 Encounter for screening, unspecified: Secondary | ICD-10-CM

## 2013-01-11 DIAGNOSIS — D259 Leiomyoma of uterus, unspecified: Secondary | ICD-10-CM

## 2013-01-11 DIAGNOSIS — I10 Essential (primary) hypertension: Secondary | ICD-10-CM

## 2013-01-11 DIAGNOSIS — R7301 Impaired fasting glucose: Secondary | ICD-10-CM

## 2013-01-11 DIAGNOSIS — Z1231 Encounter for screening mammogram for malignant neoplasm of breast: Secondary | ICD-10-CM | POA: Insufficient documentation

## 2013-01-11 DIAGNOSIS — Z79899 Other long term (current) drug therapy: Secondary | ICD-10-CM

## 2013-01-11 DIAGNOSIS — R5381 Other malaise: Secondary | ICD-10-CM

## 2013-01-11 DIAGNOSIS — N920 Excessive and frequent menstruation with regular cycle: Secondary | ICD-10-CM | POA: Insufficient documentation

## 2013-01-11 DIAGNOSIS — E663 Overweight: Secondary | ICD-10-CM

## 2013-01-11 DIAGNOSIS — D219 Benign neoplasm of connective and other soft tissue, unspecified: Secondary | ICD-10-CM

## 2013-01-11 DIAGNOSIS — E785 Hyperlipidemia, unspecified: Secondary | ICD-10-CM

## 2013-01-11 NOTE — Patient Instructions (Addendum)
F/u in 6  Month, call if you need me before  It is important that you exercise regularly at least 30 minutes 7 times a week. If you develop chest pain, have severe difficulty breathing, or feel very tired, stop exercising immediately and seek medical attention   A healthy diet is rich in fruit, vegetables and whole grains. Poultry fish, nuts and beans are a healthy choice for protein rather then red meat. A low sodium diet and drinking 64 ounces of water daily is generally recommended. Oils and sweet should be limited. Carbohydrates especially for those who are diabetic or overweight, should be limited to 30-45 gram per meal. It is important to eat on a regular schedule, at least 3 times daily. Snacks should be primarily fruits, vegetables or nuts.  Fasting lipid, cmp, HBa1C, TSh and vit D, cbc , mid March  Weight loss goal of 8 to 10 pounds    You will get a 1500 calorie diet sheet  Please start 1 multivitamin one daily  At age 52 you need calcium with vit D 1200mg /100IU daily

## 2013-01-18 LAB — LIPID PANEL
Cholesterol: 181 mg/dL (ref 0–200)
Total CHOL/HDL Ratio: 4.3 Ratio
Triglycerides: 59 mg/dL (ref <150)
VLDL: 12 mg/dL (ref 0–40)

## 2013-01-18 LAB — COMPREHENSIVE METABOLIC PANEL
Albumin: 4 g/dL (ref 3.5–5.2)
Alkaline Phosphatase: 36 U/L — ABNORMAL LOW (ref 39–117)
BUN: 12 mg/dL (ref 6–23)
Calcium: 9.1 mg/dL (ref 8.4–10.5)
Chloride: 105 mEq/L (ref 96–112)
Glucose, Bld: 80 mg/dL (ref 70–99)
Potassium: 4.2 mEq/L (ref 3.5–5.3)

## 2013-01-18 LAB — CBC WITH DIFFERENTIAL/PLATELET
Basophils Relative: 1 % (ref 0–1)
Eosinophils Absolute: 0.1 10*3/uL (ref 0.0–0.7)
MCH: 32.7 pg (ref 26.0–34.0)
MCHC: 34.3 g/dL (ref 30.0–36.0)
Neutrophils Relative %: 56 % (ref 43–77)
Platelets: 215 10*3/uL (ref 150–400)

## 2013-01-18 LAB — TSH: TSH: 1.702 u[IU]/mL (ref 0.350–4.500)

## 2013-01-18 LAB — HEMOGLOBIN A1C: Mean Plasma Glucose: 111 mg/dL (ref <117)

## 2013-01-19 ENCOUNTER — Other Ambulatory Visit: Payer: Self-pay | Admitting: Family Medicine

## 2013-01-19 ENCOUNTER — Telehealth: Payer: Self-pay | Admitting: Family Medicine

## 2013-01-19 ENCOUNTER — Other Ambulatory Visit: Payer: Self-pay

## 2013-01-19 DIAGNOSIS — E785 Hyperlipidemia, unspecified: Secondary | ICD-10-CM | POA: Insufficient documentation

## 2013-01-19 MED ORDER — TRIAMTERENE-HCTZ 37.5-25 MG PO TABS: ORAL_TABLET | ORAL | Status: DC

## 2013-01-19 MED ORDER — ERGOCALCIFEROL 1.25 MG (50000 UT) PO CAPS: 50000.0000 [IU] | ORAL_CAPSULE | ORAL | Status: DC

## 2013-01-19 NOTE — Assessment & Plan Note (Signed)
Unchanged. Patient re-educated about  the importance of commitment to a  minimum of 150 minutes of exercise per week. The importance of healthy food choices with portion control discussed. Encouraged to start a food diary, count calories and to consider  joining a support group. Sample diet sheets offered. Goals set by the patient for the next several months.    

## 2013-01-19 NOTE — Assessment & Plan Note (Signed)
Controlled, no change in medication DASH diet and commitment to daily physical activity for a minimum of 30 minutes discussed and encouraged, as a part of hypertension management. The importance of attaining a healthy weight is also discussed.  

## 2013-01-19 NOTE — Telephone Encounter (Signed)
Noted and will send in 90 days

## 2013-01-19 NOTE — Assessment & Plan Note (Signed)
Weekly vit D supplement x 6 month needed based on labs obtained after visit, pt too be notified

## 2013-01-19 NOTE — Assessment & Plan Note (Signed)
C/o excessive menstrual flow in terms of volume and frequency, refer to gyne for further management

## 2013-01-19 NOTE — Assessment & Plan Note (Signed)
Elevated LDL persists. Increasing heart disease and vascular disease risk. Low fat diet advised and encouraged

## 2013-01-19 NOTE — Progress Notes (Signed)
  Subjective:    Patient ID: Caitlyn Patterson, female    DOB: [birth year], 52 y.o.   MRN: [id number]  HPI  The PT is here for follow up and re-evaluation of chronic medical conditions, medication management and review of any available recent lab and radiology data.  Preventive health is updated, specifically  Cancer screening and Immunization.   Questions or concerns regarding consultations or procedures which the PT has had in the interim are  addressed. The PT denies any adverse reactions to current medications since the last visit.  There are no new concerns.  There are no specific complaints      Review of Systems See HPI Denies recent fever or chills. Denies sinus pressure, nasal congestion, ear pain or sore throat. Denies chest congestion, productive cough or wheezing. Denies chest pains, palpitations and leg swelling Denies abdominal pain, nausea, vomiting,diarrhea or constipation.   Denies dysuria, frequency, hesitancy or incontinence. Denies joint pain, swelling and limitation in mobility. Denies headaches, seizures, numbness, or tingling. Denies depression, anxiety or insomnia. Denies skin break down or rash.        Objective:   Physical Exam   Patient alert and oriented and in no cardiopulmonary distress.  HEENT: No facial asymmetry, EOMI, no sinus tenderness,  oropharynx pink and moist.  Neck supple no adenopathy.  Chest: Clear to auscultation bilaterally.  CVS: S1, S2 no murmurs, no S3.  ABD: Soft non tender. Bowel sounds normal.  Ext: No edema  MS: Adequate ROM spine, shoulders, hips and knees.  Skin: Intact, no ulcerations or rash noted.  Psych: Good eye contact, normal affect. Memory intact not anxious or depressed appearing.  CNS: CN 2-12 intact, power, tone and sensation normal throughout.      Assessment & Plan:

## 2013-02-03 ENCOUNTER — Other Ambulatory Visit: Payer: Self-pay | Admitting: Adult Health

## 2013-02-03 MED ORDER — NORETHIN-ETH ESTRAD TRIPHASIC 0.5/0.75/1-35 MG-MCG PO TABS: 1.0000 | ORAL_TABLET | Freq: Every day | ORAL | Status: DC

## 2013-04-06 ENCOUNTER — Other Ambulatory Visit: Payer: Self-pay | Admitting: Adult Health

## 2013-04-19 ENCOUNTER — Encounter: Payer: Self-pay | Admitting: *Deleted

## 2013-04-20 ENCOUNTER — Ambulatory Visit (INDEPENDENT_AMBULATORY_CARE_PROVIDER_SITE_OTHER): Payer: BC Managed Care – PPO | Admitting: Adult Health

## 2013-04-20 ENCOUNTER — Other Ambulatory Visit (HOSPITAL_COMMUNITY)
Admission: RE | Admit: 2013-04-20 | Discharge: 2013-04-20 | Disposition: A | Discharge status: Home / Self Care | Payer: BC Managed Care – PPO | Source: Ambulatory Visit | Attending: Adult Health | Admitting: Adult Health

## 2013-04-20 ENCOUNTER — Encounter: Payer: Self-pay | Admitting: Adult Health

## 2013-04-20 VITALS — BP 118/70 | HR 72 | Ht 66.0 in | Wt 190.0 lb

## 2013-04-20 DIAGNOSIS — Z1151 Encounter for screening for human papillomavirus (HPV): Secondary | ICD-10-CM | POA: Insufficient documentation

## 2013-04-20 DIAGNOSIS — N816 Rectocele: Secondary | ICD-10-CM

## 2013-04-20 DIAGNOSIS — D219 Benign neoplasm of connective and other soft tissue, unspecified: Secondary | ICD-10-CM | POA: Insufficient documentation

## 2013-04-20 DIAGNOSIS — Z309 Encounter for contraceptive management, unspecified: Secondary | ICD-10-CM

## 2013-04-20 DIAGNOSIS — Z01419 Encounter for gynecological examination (general) (routine) without abnormal findings: Secondary | ICD-10-CM

## 2013-04-20 DIAGNOSIS — I10 Essential (primary) hypertension: Secondary | ICD-10-CM

## 2013-04-20 DIAGNOSIS — Z1212 Encounter for screening for malignant neoplasm of rectum: Secondary | ICD-10-CM

## 2013-04-20 HISTORY — DX: Rectocele: N81.6

## 2013-04-20 LAB — HEMOCCULT GUIAC POC 1CARD (OFFICE): Fecal Occult Blood, POC: NEGATIVE

## 2013-04-20 MED ORDER — NORETHIN-ETH ESTRAD TRIPHASIC 0.5/0.75/1-35 MG-MCG PO TABS: 1.0000 | ORAL_TABLET | Freq: Every day | ORAL | Status: DC

## 2013-04-20 NOTE — Progress Notes (Signed)
Patient ID: Caitlyn Patterson, female   DOB: 11/04/1961, 52 y.o.   MRN: [id number] History of Present Illness: Caitlyn Patterson is a 52 year old black female, married in for a pap and physical. She has had some itching.  Current Medications, Allergies, Past Medical History, Past Surgical History, Family History and Social History were reviewed in [employer] record.     Review of Systems: Patient denies any headaches, blurred vision, shortness of breath, chest pain, abdominal pain, problems with bowel movements, urination, or intercourse. No joint pain or swelling,no mood changes Her periods are heavy the first few days, but she is happy with her pills, will continue til 52 the stop x 1 month and check FSH.  Physical Exam:BP 118/70  Pulse 72  Ht 5\' 6"  (1.676 m)  Wt 190 lb (86.183 kg)  BMI 30.68 kg/m2  LMP 04/06/2013 General:  Well developed, well nourished, no acute distress Skin:  Warm and dry Neck:  Midline trachea, normal thyroid Lungs; Clear to auscultation bilaterally Breast:  No dominant palpable mass, retraction, or nipple discharge Cardiovascular: Regular rate and rhythm Abdomen:  Soft, non tender, no hepatosplenomegaly Pelvic:  External genitalia is normal in appearance.  The vagina is normal in appearance. The cervix is smooth, with no CMT.Pap performed with HPV. Uterus is felt to be enlarged.  No adnexal masses or tenderness noted. Rectal: Good sphincter tone, no polyps, or hemorrhoids felt.  Hemoccult negative.Mild rectocele. Extremities:  No swelling  noted Psych:  Alert and cooperative, seems happy   Impression: Yearly GYN exam Fibroids Rectocele Contraceptive management History of hypertension  Plan: Rx Nortrel 777  X 1 year Physical in 1 year Mammogram yearly Colonoscopy at 52 or as per GI Labs per PCP

## 2013-04-20 NOTE — Patient Instructions (Addendum)
Physical in 1 year Mammogram yearly Colonoscopy at 52 or as per GI Labs per PCP

## 2013-06-18 ENCOUNTER — Ambulatory Visit: Payer: BC Managed Care – PPO | Admitting: Family Medicine

## 2013-07-05 ENCOUNTER — Ambulatory Visit: Payer: BC Managed Care – PPO | Admitting: Family Medicine

## 2013-07-23 ENCOUNTER — Other Ambulatory Visit: Payer: Self-pay | Admitting: Family Medicine

## 2013-08-06 ENCOUNTER — Encounter: Payer: Self-pay | Admitting: Adult Health

## 2013-08-06 ENCOUNTER — Ambulatory Visit (INDEPENDENT_AMBULATORY_CARE_PROVIDER_SITE_OTHER): Payer: BC Managed Care – PPO | Admitting: Adult Health

## 2013-08-06 VITALS — BP 108/80 | Ht 65.0 in | Wt 189.0 lb

## 2013-08-06 DIAGNOSIS — B373 Candidiasis of vulva and vagina: Secondary | ICD-10-CM

## 2013-08-06 DIAGNOSIS — B3731 Acute candidiasis of vulva and vagina: Secondary | ICD-10-CM

## 2013-08-06 DIAGNOSIS — N898 Other specified noninflammatory disorders of vagina: Secondary | ICD-10-CM

## 2013-08-06 DIAGNOSIS — R319 Hematuria, unspecified: Secondary | ICD-10-CM

## 2013-08-06 HISTORY — DX: Candidiasis of vulva and vagina: B37.3

## 2013-08-06 HISTORY — DX: Acute candidiasis of vulva and vagina: B37.31

## 2013-08-06 LAB — POCT WET PREP (WET MOUNT)

## 2013-08-06 LAB — POCT URINALYSIS DIPSTICK
Ketones, UA: NEGATIVE
Protein, UA: NEGATIVE

## 2013-08-06 MED ORDER — FLUCONAZOLE 150 MG PO TABS: ORAL_TABLET | ORAL | Status: DC

## 2013-08-06 NOTE — Progress Notes (Signed)
Subjective:     Patient ID: Tyson Alias, female   DOB: 1961-04-26, 52 y.o.   MRN: [id number]  HPI Nyja is in complaining of vaginal discharge and itch with some burning and odor.Has not started BCP this week and has spotted.  Review of Systems Positives HPI Denies any new soaps,detergent or clothing did soak in epsom salt.No new partners. Reviewed past medical,surgical, social and family history. Reviewed medications and allergies.     Objective:   Physical Exam BP 108/80  Ht 5\' 5"  (1.651 m)  Wt 189 lb (85.73 kg)  BMI 31.45 kg/m2  LMP 07/23/2013   Urine trace WBC and trace blood, Skin warm and dry.Pelvic: external genitalia is normal in appearance, vagina: white discharge without odor, cervix: nullipareous, uterus: normal size, shape and contour, non tender, no masses felt, adnexa: no masses or tenderness noted. Wet prep: + for yeast  and +WBCs.   Assessment:     Vaginal discharge Hematuria Yeast    Plan:     Rx diflucan 150 mg 1 now and 1 in 3 days if needed with 1 refill Review handout on yeast  Follow up prn Start pills on Sunday and use condoms

## 2013-08-06 NOTE — Patient Instructions (Addendum)
Monilial Vaginitis Vaginitis in a soreness, swelling and redness (inflammation) of the vagina and vulva. Monilial vaginitis is not a sexually transmitted infection. CAUSES  Yeast vaginitis is caused by yeast (candida) that is normally found in your vagina. With a yeast infection, the candida has overgrown in number to a point that upsets the chemical balance. SYMPTOMS   White, thick vaginal discharge.  Swelling, itching, redness and irritation of the vagina and possibly the lips of the vagina (vulva).  Burning or painful urination.  Painful intercourse. DIAGNOSIS  Things that may contribute to monilial vaginitis are:  Postmenopausal and virginal states.  Pregnancy.  Infections.  Being tired, sick or stressed, especially if you had monilial vaginitis in the past.  Diabetes. Good control will help lower the chance.  Birth control pills.  Tight fitting garments.  Using bubble bath, feminine sprays, douches or deodorant tampons.  Taking certain medications that kill germs (antibiotics).  Sporadic recurrence can occur if you become ill. TREATMENT  Your caregiver will give you medication.  There are several kinds of anti monilial vaginal creams and suppositories specific for monilial vaginitis. For recurrent yeast infections, use a suppository or cream in the vagina 2 times a week, or as directed.  Anti-monilial or steroid cream for the itching or irritation of the vulva may also be used. Get your caregiver's permission.  Painting the vagina with methylene blue solution may help if the monilial cream does not work.  Eating yogurt may help prevent monilial vaginitis. HOME CARE INSTRUCTIONS   Finish all medication as prescribed.  Do not have sex until treatment is completed or after your caregiver tells you it is okay.  Take warm sitz baths.  Do not douche.  Do not use tampons, especially scented ones.  Wear cotton underwear.  Avoid tight pants and panty  hose.  Tell your sexual partner that you have a yeast infection. They should go to their caregiver if they have symptoms such as mild rash or itching.  Your sexual partner should be treated as well if your infection is difficult to eliminate.  Practice safer sex. Use condoms.  Some vaginal medications cause latex condoms to fail. Vaginal medications that harm condoms are:  Cleocin cream.  Butoconazole (Femstat).  Terconazole (Terazol) vaginal suppository.  Miconazole (Monistat) (may be purchased over the counter). SEEK MEDICAL CARE IF:   You have a temperature by mouth above 102 F (38.9 C).  The infection is getting worse after 2 days of treatment.  The infection is not getting better after 3 days of treatment.  You develop blisters in or around your vagina.  You develop vaginal bleeding, and it is not your menstrual period.  You have pain when you urinate.  You develop intestinal problems.  You have pain with sexual intercourse. Document Released: 08/14/2005 Document Revised: 01/27/2012 Document Reviewed: 04/28/2009 Exit[employer] Patient Information 2014 [employer]. Take diflucan  Follow up prn 

## 2013-08-16 ENCOUNTER — Other Ambulatory Visit: Payer: Self-pay

## 2013-08-16 MED ORDER — TRIAMTERENE-HCTZ 37.5-25 MG PO TABS: ORAL_TABLET | ORAL | Status: DC

## 2013-08-21 ENCOUNTER — Other Ambulatory Visit: Payer: Self-pay | Admitting: Family Medicine

## 2013-10-22 ENCOUNTER — Encounter (INDEPENDENT_AMBULATORY_CARE_PROVIDER_SITE_OTHER): Payer: Self-pay

## 2013-10-22 ENCOUNTER — Ambulatory Visit (INDEPENDENT_AMBULATORY_CARE_PROVIDER_SITE_OTHER): Payer: BC Managed Care – PPO | Admitting: Family Medicine

## 2013-10-22 ENCOUNTER — Encounter: Payer: Self-pay | Admitting: Family Medicine

## 2013-10-22 VITALS — BP 118/78 | HR 88 | Resp 16 | Ht 65.5 in | Wt 188.8 lb

## 2013-10-22 DIAGNOSIS — E559 Vitamin D deficiency, unspecified: Secondary | ICD-10-CM

## 2013-10-22 DIAGNOSIS — R200 Anesthesia of skin: Secondary | ICD-10-CM

## 2013-10-22 DIAGNOSIS — R7302 Impaired glucose tolerance (oral): Secondary | ICD-10-CM

## 2013-10-22 DIAGNOSIS — R7309 Other abnormal glucose: Secondary | ICD-10-CM

## 2013-10-22 DIAGNOSIS — E663 Overweight: Secondary | ICD-10-CM

## 2013-10-22 DIAGNOSIS — R209 Unspecified disturbances of skin sensation: Secondary | ICD-10-CM

## 2013-10-22 DIAGNOSIS — N951 Menopausal and female climacteric states: Secondary | ICD-10-CM

## 2013-10-22 DIAGNOSIS — Z23 Encounter for immunization: Secondary | ICD-10-CM

## 2013-10-22 DIAGNOSIS — E785 Hyperlipidemia, unspecified: Secondary | ICD-10-CM

## 2013-10-22 DIAGNOSIS — I10 Essential (primary) hypertension: Secondary | ICD-10-CM

## 2013-10-22 MED ORDER — TRIAMTERENE-HCTZ 37.5-25 MG PO TABS: ORAL_TABLET | ORAL | Status: DC

## 2013-10-22 MED ORDER — ERGOCALCIFEROL 1.25 MG (50000 UT) PO CAPS: 50000.0000 [IU] | ORAL_CAPSULE | ORAL | Status: DC

## 2013-10-22 NOTE — Progress Notes (Signed)
   Subjective:    Patient ID: Caitlyn Patterson, female    DOB: 11-29-1960, 52 y.o.   MRN: [id number]  HPI The PT is here for follow up and re-evaluation of chronic medical conditions, medication management and review of any available recent lab and radiology data.  Preventive health is updated, specifically  Cancer screening and Immunization.    The PT denies any adverse reactions to current medications since the last visit.  C/o hot flashes and no periods since stopping OCP in September. C/o right hand tingling esp on days that she works, sometimes awakens her at night, has to "shake it ou". Denies weakness of her hands Inconsistency in exercise, but has chnaged diet  For improved health     Review of Systems See HPI Denies recent fever or chills. Denies sinus pressure, nasal congestion, ear pain or sore throat. Denies chest congestion, productive cough or wheezing. Denies chest pains, palpitations and leg swelling Denies abdominal pain, nausea, vomiting,diarrhea or constipation.   Denies dysuria, frequency, hesitancy or incontinence. Denies joint pain, swelling and limitation in mobility.  Denies depression, anxiety or insomnia. Denies skin break down or rash.        Objective:   Physical Exam Patient alert and oriented and in no cardiopulmonary distress.  HEENT: No facial asymmetry, EOMI, no sinus tenderness,  oropharynx pink and moist.  Neck supple no adenopathy.  Chest: Clear to auscultation bilaterally.  CVS: S1, S2 no murmurs, no S3.  ABD: Soft non tender. Bowel sounds normal.  Ext: No edema  MS: Adequate ROM spine, shoulders, hips and knees.Bilateral thenar wasting, left greater than right , no weakness in grip, negative tinnel's  Skin: Intact, no ulcerations or rash noted.  Psych: Good eye contact, normal affect. Memory intact not anxious or depressed appearing.  CNS: CN 2-12 intact, power, tone and sensation normal throughout.        Assessment &  Plan:

## 2013-10-22 NOTE — Patient Instructions (Addendum)
F/u  End April, call iof you need me before  You will get info on hot flashes  CbC , lipid, chem 7, TSH, vit D and hBa1C  End April   It is important that you exercise regularly at least 30 minutes 5 times a week. If you develop chest pain, have severe difficulty breathing, or feel very tired, stop exercising immediately and seek medical attention   Eat increased amt of vegetable and fruit  Discuss the tingling with company Doc, since aggravated on the job  No med changes  Weight loss goal of 5 pounds

## 2013-10-24 DIAGNOSIS — R200 Anesthesia of skin: Secondary | ICD-10-CM | POA: Insufficient documentation

## 2013-10-24 DIAGNOSIS — N951 Menopausal and female climacteric states: Secondary | ICD-10-CM | POA: Insufficient documentation

## 2013-10-24 NOTE — Assessment & Plan Note (Signed)
Symptoms c/w carpal tunnel syndrome, mild, aggravated by repitive hand motion on the job, pt to further address through her company Doc

## 2013-10-24 NOTE — Assessment & Plan Note (Signed)
Improved. Pt applauded on succesful weight loss through lifestyle change, and encouraged to continue same. Weight loss goal set for the next several months.  

## 2013-10-24 NOTE — Assessment & Plan Note (Signed)
Continue weekly supplement, rept lab 02/2014

## 2013-10-24 NOTE — Assessment & Plan Note (Signed)
Hyperlipidemia:Low fat diet discussed and encouraged.  Updated lab next visit 

## 2013-10-24 NOTE — Assessment & Plan Note (Signed)
Controlled, no change in medication DASH diet and commitment to daily physical activity for a minimum of 30 minutes discussed and encouraged, as a part of hypertension management. The importance of attaining a healthy weight is also discussed.  

## 2013-10-24 NOTE — Assessment & Plan Note (Signed)
Pt discontinued OCP in Sept 2014, since then, no menses, and increased hot flashes. Counseled re natural managment of symptoms and written  info provided also

## 2013-12-15 ENCOUNTER — Other Ambulatory Visit: Payer: Self-pay | Admitting: Family Medicine

## 2013-12-15 DIAGNOSIS — Z139 Encounter for screening, unspecified: Secondary | ICD-10-CM

## 2014-01-14 ENCOUNTER — Ambulatory Visit (HOSPITAL_COMMUNITY)
Admission: RE | Admit: 2014-01-14 | Discharge: 2014-01-14 | Disposition: A | Discharge status: Home / Self Care | Payer: BC Managed Care – PPO | Source: Ambulatory Visit | Attending: Family Medicine | Admitting: Family Medicine

## 2014-01-14 DIAGNOSIS — Z139 Encounter for screening, unspecified: Secondary | ICD-10-CM

## 2014-01-14 DIAGNOSIS — Z1231 Encounter for screening mammogram for malignant neoplasm of breast: Secondary | ICD-10-CM | POA: Insufficient documentation

## 2014-03-16 ENCOUNTER — Ambulatory Visit: Payer: BC Managed Care – PPO | Admitting: Family Medicine

## 2014-05-25 ENCOUNTER — Other Ambulatory Visit: Payer: Self-pay

## 2014-05-25 MED ORDER — TRIAMTERENE-HCTZ 37.5-25 MG PO TABS: ORAL_TABLET | ORAL | Status: DC

## 2014-05-26 ENCOUNTER — Other Ambulatory Visit: Payer: Self-pay

## 2014-05-26 MED ORDER — TRIAMTERENE-HCTZ 37.5-25 MG PO TABS: ORAL_TABLET | ORAL | Status: DC

## 2014-07-15 ENCOUNTER — Encounter: Payer: Self-pay | Admitting: Adult Health

## 2014-07-15 ENCOUNTER — Ambulatory Visit (INDEPENDENT_AMBULATORY_CARE_PROVIDER_SITE_OTHER): Payer: BC Managed Care – PPO | Admitting: Adult Health

## 2014-07-15 VITALS — BP 110/72 | HR 72 | Ht 65.0 in | Wt 187.0 lb

## 2014-07-15 DIAGNOSIS — N898 Other specified noninflammatory disorders of vagina: Secondary | ICD-10-CM | POA: Insufficient documentation

## 2014-07-15 DIAGNOSIS — Z1212 Encounter for screening for malignant neoplasm of rectum: Secondary | ICD-10-CM

## 2014-07-15 DIAGNOSIS — Z01419 Encounter for gynecological examination (general) (routine) without abnormal findings: Secondary | ICD-10-CM

## 2014-07-15 DIAGNOSIS — Z780 Asymptomatic menopausal state: Secondary | ICD-10-CM

## 2014-07-15 HISTORY — DX: Other specified noninflammatory disorders of vagina: N89.8

## 2014-07-15 HISTORY — DX: Asymptomatic menopausal state: Z78.0

## 2014-07-15 LAB — HEMOCCULT GUIAC POC 1CARD (OFFICE): Fecal Occult Blood, POC: NEGATIVE

## 2014-07-15 NOTE — Progress Notes (Signed)
Patient ID: Caitlyn Patterson, female   DOB: 12-24-1960, 53 y.o.   MRN: [id number] History of Present Illness: Mindie is a 53 year old black female, in for a physical, she had a normal pap with negative HPV 04/20/13.   Current Medications, Allergies, Past Medical History, Past Surgical History, Family History and Social History were reviewed in [employer] record.     Review of Systems: Patient denies any headaches, blurred vision, shortness of breath, chest pain, abdominal pain, problems with bowel movements, urination, or intercourse. No joint pain or mood swings, some hot flashes and vaginal dryness.    Physical Exam:BP 110/72  Pulse 72  Ht 5\' 5"  (1.651 m)  Wt 187 lb (84.823 kg)  BMI 31.12 kg/m2  LMP 07/23/2013 General:  Well developed, well nourished, no acute distress Skin:  Warm and dry Neck:  Midline trachea, normal thyroid Lungs; Clear to auscultation bilaterally Breast:  No dominant palpable mass, retraction, or nipple discharge Cardiovascular: Regular rate and rhythm Abdomen:  Soft, non tender, no hepatosplenomegaly Pelvic:  External genitalia is normal in appearance.  The vagina has decrease color, moisture and rugae. The cervix is smooth..  Uterus is felt to be normal size, shape, and contour.  No                adnexal masses or tenderness noted. Rectal: Good sphincter tone, no polyps, or hemorrhoids felt.  Hemoccult negative.+rectocele Extremities:  No swelling or varicosities noted Psych:  No mood changes, alert and cooperative,seems happy Discussed could try HRT, or will follow for now but can try luvena for vaginal moisture and astroglide with sex.She wants to follow for now.If has any bleeding after 366 days without period let us know would need to check it out and get [place].  Impression: Yearly gyn exam no pap Menopause Vaginal dryness    Plan: Physical in 1 year Mammogram yearly  Labs with PCP Colonoscopy in 2018 Try luvena and astro  glide Review handout on menopause

## 2014-07-15 NOTE — Patient Instructions (Signed)
Physical in  1 year Mammogram yearly  Menopause Menopause is the normal time of life when menstrual periods stop completely. Menopause is complete when you have missed 12 consecutive menstrual periods. It usually occurs between the ages of 38 years and 72 years. Very rarely does a woman develop menopause before the age of 36 years. At menopause, your ovaries stop producing the female hormones estrogen and progesterone. This can cause undesirable symptoms and also affect your health. Sometimes the symptoms may occur 4-5 years before the menopause begins. There is no relationship between menopause and:  Oral contraceptives.  Number of children you had.  Race.  The age your menstrual periods started (menarche). Heavy smokers and very thin women may develop menopause earlier in life. CAUSES  The ovaries stop producing the female hormones estrogen and progesterone.  Other causes include:  Surgery to remove both ovaries.  The ovaries stop functioning for no known reason.  Tumors of the pituitary gland in the brain.  Medical disease that affects the ovaries and hormone production.  Radiation treatment to the abdomen or pelvis.  Chemotherapy that affects the ovaries. SYMPTOMS   Hot flashes.  Night sweats.  Decrease in sex drive.  Vaginal dryness and thinning of the vagina causing painful intercourse.  Dryness of the skin and developing wrinkles.  Headaches.  Tiredness.  Irritability.  Memory problems.  Weight gain.  Bladder infections.  Hair growth of the face and chest.  Infertility. More serious symptoms include:  Loss of bone (osteoporosis) causing breaks (fractures).  Depression.  Hardening and narrowing of the arteries (atherosclerosis) causing heart attacks and strokes. DIAGNOSIS   When the menstrual periods have stopped for 12 straight months.  Physical exam.  Hormone studies of the blood. TREATMENT  There are many treatment choices and nearly as  many questions about them. The decisions to treat or not to treat menopausal changes is an individual choice made with your health care provider. Your health care provider can discuss the treatments with you. Together, you can decide which treatment will work best for you. Your treatment choices may include:   Hormone therapy (estrogen and progesterone).  Non-hormonal medicines.  Treating the individual symptoms with medicine (for example antidepressants for depression).  Herbal medicines that may help specific symptoms.  Counseling by a psychiatrist or psychologist.  Group therapy.  Lifestyle changes including:  Eating healthy.  Regular exercise.  Limiting caffeine and alcohol.  Stress management and meditation.  No treatment. HOME CARE INSTRUCTIONS   Take the medicine your health care provider gives you as directed.  Get plenty of sleep and rest.  Exercise regularly.  Eat a diet that contains calcium (good for the bones) and soy products (acts like estrogen hormone).  Avoid alcoholic beverages.  Do not smoke.  If you have hot flashes, dress in layers.  Take supplements, calcium, and vitamin D to strengthen bones.  You can use over-the-counter lubricants or moisturizers for vaginal dryness.  Group therapy is sometimes very helpful.  Acupuncture may be helpful in some cases. SEEK MEDICAL CARE IF:   You are not sure you are in menopause.  You are having menopausal symptoms and need advice and treatment.  You are still having menstrual periods after age 24 years.  You have pain with intercourse.  Menopause is complete (no menstrual period for 12 months) and you develop vaginal bleeding.  You need a referral to a specialist (gynecologist, psychiatrist, or psychologist) for treatment. SEEK IMMEDIATE MEDICAL CARE IF:   You have severe  depression.  You have excessive vaginal bleeding.  You fell and think you have a broken bone.  You have pain when you  urinate.  You develop leg or chest pain.  You have a fast pounding heart beat (palpitations).  You have severe headaches.  You develop vision problems.  You feel a lump in your breast.  You have abdominal pain or severe indigestion. Document Released: 01/25/2004 Document Revised: 07/07/2013 Document Reviewed: 06/03/2013 [facility] Patient Information 2015 [place], [place]. This information is not intended to replace advice given to you by your health care provider. Make sure you discuss any questions you have with your health care provider. colonoscopy 2018  Labs PCP Try Samul Dada and astro glide with sex

## 2014-09-19 ENCOUNTER — Encounter: Payer: Self-pay | Admitting: Adult Health

## 2014-09-27 ENCOUNTER — Ambulatory Visit (INDEPENDENT_AMBULATORY_CARE_PROVIDER_SITE_OTHER): Payer: BC Managed Care – PPO | Admitting: Family Medicine

## 2014-09-27 ENCOUNTER — Ambulatory Visit (HOSPITAL_COMMUNITY)
Admission: RE | Admit: 2014-09-27 | Discharge: 2014-09-27 | Disposition: A | Discharge status: Home / Self Care | Payer: BC Managed Care – PPO | Source: Ambulatory Visit | Attending: Family Medicine | Admitting: Family Medicine

## 2014-09-27 ENCOUNTER — Encounter: Payer: Self-pay | Admitting: Family Medicine

## 2014-09-27 VITALS — BP 114/70 | HR 62 | Resp 18 | Ht 65.0 in | Wt 189.1 lb

## 2014-09-27 DIAGNOSIS — M545 Low back pain: Secondary | ICD-10-CM | POA: Diagnosis not present

## 2014-09-27 DIAGNOSIS — I809 Phlebitis and thrombophlebitis of unspecified site: Secondary | ICD-10-CM

## 2014-09-27 DIAGNOSIS — M5440 Lumbago with sciatica, unspecified side: Secondary | ICD-10-CM

## 2014-09-27 DIAGNOSIS — E785 Hyperlipidemia, unspecified: Secondary | ICD-10-CM

## 2014-09-27 DIAGNOSIS — E559 Vitamin D deficiency, unspecified: Secondary | ICD-10-CM

## 2014-09-27 DIAGNOSIS — E669 Obesity, unspecified: Secondary | ICD-10-CM

## 2014-09-27 DIAGNOSIS — R7302 Impaired glucose tolerance (oral): Secondary | ICD-10-CM

## 2014-09-27 DIAGNOSIS — Z23 Encounter for immunization: Secondary | ICD-10-CM

## 2014-09-27 DIAGNOSIS — I10 Essential (primary) hypertension: Secondary | ICD-10-CM

## 2014-09-27 MED ORDER — PREDNISONE 5 MG PO KIT: PACK | ORAL | Status: DC

## 2014-09-27 NOTE — Progress Notes (Signed)
Subjective:    Patient ID: Caitlyn Patterson, female    DOB: 1961/10/05, 53 y.o.   MRN: [id number]  HPI  The PT is here for follow up and re-evaluation of chronic medical conditions, medication management and review of any available recent lab and radiology data.  Preventive health is updated, specifically  Cancer screening and Immunization.   Questions or concerns regarding consultations or procedures which the PT has had in the interim are  addressed. The PT denies any adverse reactions to current medications since the last visit.  C/o stinging pains in both lower extremities for past 6 weeks, worse after standing on the job for 8 hours. Denies incontinence of stool or urine, denies loss of power C/o painful swelling of superficial veins in her legs, progressively worsening wants eval by vascular Still not commited to lifestyle change to promote weight loss and improve health, re educated on this     Review of Systems See HPI Denies recent fever or chills. Denies sinus pressure, nasal congestion, ear pain or sore throat. Denies chest congestion, productive cough or wheezing. Denies chest pains, palpitations and leg swelling Denies abdominal pain, nausea, vomiting,diarrhea or constipation.   Denies dysuria, frequency, hesitancy or incontinence.  Denies headaches, seizures, numbness, or tingling. Denies depression, anxiety or insomnia. Denies skin break down or rash.        Objective:   Physical Exam BP 114/70 mmHg  Pulse 62  Resp 18  Ht 5\' 5"  (1.651 m)  Wt 189 lb 1.3 oz (85.766 kg)  BMI 31.46 kg/m2  SpO2 99%  LMP 07/23/2013  Patient alert and oriented and in no cardiopulmonary distress.  HEENT: No facial asymmetry, EOMI,   oropharynx pink and moist.  Neck supple no JVD, no mass.  Chest: Clear to auscultation bilaterally.  CVS: S1, S2 no murmurs, no S3.Regular rate.Superficial varicose veins in both lower extremities, not warm or tender  ABD: Soft non tender.    Ext: No edema  MS: Adequate ROM spine, shoulders, hips and knees.  Skin: Intact, no ulcerations or rash noted.  Psych: Good eye contact, normal affect. Memory intact not anxious or depressed appearing.  CNS: CN 2-12 intact, power,  normal throughout.no focal deficits noted.      Assessment & Plan:  Need for prophylactic vaccination and inoculation against influenza Vaccine administered at visit.   Essential hypertension Controlled, no change in medication DASH diet and commitment to daily physical activity for a minimum of 30 minutes discussed and encouraged, as a part of hypertension management. The importance of attaining a healthy weight is also discussed.   Dyslipidemia Hyperlipidemia:Low fat diet discussed and encouraged.   Updated lab needed at/ before next visit.   Backache C/o worsening bilateral lower extremity tingling and numbness, this cleary relates to nerve irritation/inflammation Depo medrol in office followed by oral steroid and tylenol Pt to call in next 2 weeks if no relief for imaging study Ndeurologic exam of the lower extremities is normal  Superficial phlebitis Bilateral swelling and dilatation of superficial veins in lower extremities , at times they are painful, refer to CVTS for further eval and management  Obesity (BMI 30.0-34.9) Unchanged. Patient re-educated about  the importance of commitment to a  minimum of 150 minutes of exercise per week. The importance of healthy food choices with portion control discussed. Encouraged to start a food diary, count calories and to consider  joining a support group. Sample diet sheets offered. Goals set by the patient for the next several  months.     Vitamin D deficiency Updated lab needed at/ before next visit for definitive treatment

## 2014-09-27 NOTE — Assessment & Plan Note (Signed)
Vaccine administered at visit.  

## 2014-09-27 NOTE — Patient Instructions (Addendum)
F/u in end March, pls call if you need me before  Pain in legs is likely from arthritis in back and nerve inflammation  Prednisone is prescribed for 6 days, and take tylenol one twice daily for 1 week  Pls call back if symptoms are no better in 2 weeks  X ray of low back today  You are referred to vascular sugeon about veins in legs  Flu vaccine tody  Please work on changes in diet and commitment to daily exercise to improve health  CBC , fasting lipid, chenm 7 TSH, hBA1C and vit D week of Dec  15

## 2014-10-02 DIAGNOSIS — E669 Obesity, unspecified: Secondary | ICD-10-CM | POA: Insufficient documentation

## 2014-10-02 NOTE — Assessment & Plan Note (Signed)
Updated lab needed at/ before next visit for definitive treatment

## 2014-10-02 NOTE — Assessment & Plan Note (Signed)
Hyperlipidemia:Low fat diet discussed and encouraged.  Updated lab needed at/ before next visit.  

## 2014-10-02 NOTE — Assessment & Plan Note (Signed)
Bilateral swelling and dilatation of superficial veins in lower extremities , at times they are painful, refer to CVTS for further eval and management

## 2014-10-02 NOTE — Assessment & Plan Note (Addendum)
C/o worsening bilateral lower extremity tingling and numbness, this cleary relates to nerve irritation/inflammation Depo medrol in office followed by oral steroid and tylenol Pt to call in next 2 weeks if no relief for imaging study Ndeurologic exam of the lower extremities is normal

## 2014-10-02 NOTE — Assessment & Plan Note (Signed)
Unchanged. Patient re-educated about  the importance of commitment to a  minimum of 150 minutes of exercise per week. The importance of healthy food choices with portion control discussed. Encouraged to start a food diary, count calories and to consider  joining a support group. Sample diet sheets offered. Goals set by the patient for the next several months.    

## 2014-10-02 NOTE — Assessment & Plan Note (Signed)
Controlled, no change in medication DASH diet and commitment to daily physical activity for a minimum of 30 minutes discussed and encouraged, as a part of hypertension management. The importance of attaining a healthy weight is also discussed.  

## 2014-10-11 ENCOUNTER — Other Ambulatory Visit: Payer: Self-pay

## 2014-10-11 MED ORDER — TRIAMTERENE-HCTZ 37.5-25 MG PO TABS: ORAL_TABLET | ORAL | Status: DC

## 2014-10-19 ENCOUNTER — Other Ambulatory Visit: Payer: Self-pay | Admitting: *Deleted

## 2014-10-19 DIAGNOSIS — M79605 Pain in left leg: Secondary | ICD-10-CM

## 2014-10-19 DIAGNOSIS — I809 Phlebitis and thrombophlebitis of unspecified site: Secondary | ICD-10-CM

## 2014-10-19 DIAGNOSIS — M79604 Pain in right leg: Secondary | ICD-10-CM

## 2014-11-30 ENCOUNTER — Encounter: Payer: Self-pay | Admitting: Vascular Surgery

## 2014-12-01 ENCOUNTER — Ambulatory Visit (INDEPENDENT_AMBULATORY_CARE_PROVIDER_SITE_OTHER): Payer: 59 | Admitting: Vascular Surgery

## 2014-12-01 ENCOUNTER — Ambulatory Visit (HOSPITAL_COMMUNITY)
Admission: RE | Admit: 2014-12-01 | Discharge: 2014-12-01 | Disposition: A | Discharge status: Home / Self Care | Payer: 59 | Source: Ambulatory Visit | Attending: Vascular Surgery | Admitting: Vascular Surgery

## 2014-12-01 ENCOUNTER — Encounter: Payer: Self-pay | Admitting: Vascular Surgery

## 2014-12-01 VITALS — BP 132/84 | HR 55 | Ht 65.0 in | Wt 189.9 lb

## 2014-12-01 DIAGNOSIS — I87303 Chronic venous hypertension (idiopathic) without complications of bilateral lower extremity: Secondary | ICD-10-CM | POA: Insufficient documentation

## 2014-12-01 DIAGNOSIS — M79605 Pain in left leg: Secondary | ICD-10-CM

## 2014-12-01 DIAGNOSIS — I809 Phlebitis and thrombophlebitis of unspecified site: Secondary | ICD-10-CM

## 2014-12-01 DIAGNOSIS — M79604 Pain in right leg: Secondary | ICD-10-CM

## 2014-12-01 NOTE — Progress Notes (Signed)
[facility] [place] HISTORY AND PHYSICAL   History of Present Illness:  Patient is a 54 y.o. year old female who presents for evaluation of numbness and tingling in her calves. This is been present for approximate 3-4 months. She did have sclerotherapy of some superficial veins 10 years ago. Some of this did return. She has not worn compression stockings. She states the legs become worse with standing or walking for long periods of time. She does get occasional swelling of her lower extremities. She states that her symptoms do improve with elevation.  She denies history of diabetes. Other medical problems include hypertension which is currently controlled.  Past Medical History  Diagnosis Date  . Fibroid   . Hypertension   . BV (bacterial vaginosis)   . Rectocele 04/20/2013  . Yeast vaginitis 08/06/2013  . Vaginal dryness 07/15/2014  . Menopause 07/15/2014    Past Surgical History  Procedure Laterality Date  . No past surgeries      Social History History  Substance Use Topics  . Smoking status: Never Smoker   . Smokeless tobacco: Never Used  . Alcohol Use: 1.2 - 1.8 oz/week    1-2 Glasses of wine, 1 Shots of liquor per week     Comment: occ    Family History Family History  Problem Relation Age of Onset  . Diabetes Other   . Hypertension Other   . Cancer Maternal Grandmother     colon   . Stroke Mother   . Hypertension Mother   . Cancer Maternal Aunt     breast  . Heart attack Father   . Hypertension Brother   . Hypertension Sister   . Varicose Veins Sister     Allergies  Allergies  Allergen Reactions  . Amoxicillin     REACTION: bumps and red itchy rash  . Ibuprofen     REACTION: rash     Current Outpatient Prescriptions  Medication Sig Dispense Refill  . Multiple Vitamin (MULTIVITAMIN) tablet Take 1 tablet by mouth daily.    . Omega-3 Fatty Acids (OMEGA 3 PO) Take by mouth 2 (two) times daily.    . PredniSONE 5 MG KIT As directed 21  each 0  . triamterene-hydrochlorothiazide (MAXZIDE-25) 37.5-25 MG per tablet TAKE 1 TABLET EVERY DAY 90 tablet 1   No current facility-administered medications for this visit.    ROS:   General:  No weight loss, Fever, chills  HEENT: No recent headaches, no nasal bleeding, no visual changes, no sore throat  Neurologic: No dizziness, blackouts, seizures. No recent symptoms of stroke or mini- stroke. No recent episodes of slurred speech, or temporary blindness.  Cardiac: No recent episodes of chest pain/pressure, no shortness of breath at rest.  No shortness of breath with exertion.  Denies history of atrial fibrillation or irregular heartbeat  Vascular: No history of rest pain in feet.  No history of claudication.  No history of non-healing ulcer, No history of DVT   Pulmonary: No home oxygen, no productive cough, no hemoptysis,  No asthma or wheezing  Musculoskeletal:  [ ]  Arthritis, [ ]  Low back pain,  [ ]  Joint pain  Hematologic:No history of hypercoagulable state.  No history of easy bleeding.  No history of anemia  Gastrointestinal: No hematochezia or melena,  No gastroesophageal reflux, no trouble swallowing  Urinary: [ ]  chronic Kidney disease, [ ]  on HD - [ ]  MWF or [ ]  TTHS, [ ]  Burning with urination, [ ]  Frequent urination, [ ]   Difficulty urinating;   Skin: No rashes  Psychological: No history of anxiety,  No history of depression   Physical Examination  Filed Vitals:   12/01/14 1106  BP: 132/84  Pulse: 55  Height: $Remove'5\' 5"'[id number]$  (1.651 m)  Weight: 189 lb 14.4 oz (86.138 kg)  SpO2: 100%    Body mass index is 31.6 kg/(m^2).  General:  Alert and oriented, no acute distress HEENT: Normal Neck: No bruit or JVD Pulmonary: Clear to auscultation bilaterally Cardiac: Regular Rate and Rhythm without murmur Abdomen: Soft, non-tender, non-distended, no mass Skin: No rash, scattered spider-type varicosities primarily anterior thigh knee and calf area no obvious large  varicosities Extremity Pulses:  2+ radial, brachial, femoral, dorsalis pedis, posterior tibial pulses bilaterally Musculoskeletal: No deformity or edema  Neurologic: Upper and lower extremity motor 5/5 and symmetric  DATA:  Patient had a bilateral venous reflux study today which showed no evidence of reflux in the superficial or deep venous system.   ASSESSMENT:  Although no objective evidence of patient does have some symptoms consistent with venous hypertension. I believe she would benefit from lower extremity compression stockings. She does have still several scattered spider-type varicosities. I discussed with her today the possibility of sclerotherapy for these not necessarily for improvement of her symptoms but for cosmetic reasons. I believe the mainstay of therapy for her overall symptoms is going to be bilateral lower extremity compression stockings. She was given a prescription for these today 25-30 mmHg knee-high level   PLAN:  Patient will wear her compression stockings daily. She will follow-up on as-needed basis if she wishes to consider sclerotherapy of spider varicosities in her lower extremities.  Ruta Hinds, MD [facility] [place] Office: [phone] Pager: [phone]

## 2015-01-09 ENCOUNTER — Other Ambulatory Visit: Payer: Self-pay | Admitting: Family Medicine

## 2015-01-09 DIAGNOSIS — Z1231 Encounter for screening mammogram for malignant neoplasm of breast: Secondary | ICD-10-CM

## 2015-01-16 ENCOUNTER — Ambulatory Visit (HOSPITAL_COMMUNITY)
Admission: RE | Admit: 2015-01-16 | Discharge: 2015-01-16 | Disposition: A | Discharge status: Home / Self Care | Payer: 59 | Source: Ambulatory Visit | Attending: Family Medicine | Admitting: Family Medicine

## 2015-01-16 DIAGNOSIS — Z1231 Encounter for screening mammogram for malignant neoplasm of breast: Secondary | ICD-10-CM | POA: Diagnosis present

## 2015-02-14 ENCOUNTER — Encounter: Payer: Self-pay | Admitting: Family Medicine

## 2015-02-14 ENCOUNTER — Ambulatory Visit (INDEPENDENT_AMBULATORY_CARE_PROVIDER_SITE_OTHER): Payer: 59 | Admitting: Family Medicine

## 2015-02-14 VITALS — BP 122/82 | HR 72 | Resp 16 | Ht 65.0 in | Wt 187.0 lb

## 2015-02-14 DIAGNOSIS — E785 Hyperlipidemia, unspecified: Secondary | ICD-10-CM

## 2015-02-14 DIAGNOSIS — E66811 Obesity, class 1: Secondary | ICD-10-CM

## 2015-02-14 DIAGNOSIS — R7989 Other specified abnormal findings of blood chemistry: Secondary | ICD-10-CM

## 2015-02-14 DIAGNOSIS — Z1159 Encounter for screening for other viral diseases: Secondary | ICD-10-CM | POA: Diagnosis not present

## 2015-02-14 DIAGNOSIS — E669 Obesity, unspecified: Secondary | ICD-10-CM

## 2015-02-14 DIAGNOSIS — I10 Essential (primary) hypertension: Secondary | ICD-10-CM | POA: Diagnosis not present

## 2015-02-14 DIAGNOSIS — E559 Vitamin D deficiency, unspecified: Secondary | ICD-10-CM | POA: Diagnosis not present

## 2015-02-14 NOTE — Patient Instructions (Addendum)
F/u in 6 months, call if you need me before  It is important that you exercise regularly at least 30 minutes 5 times a week. If you develop chest pain, have severe difficulty breathing, or feel very tired, stop exercising immediately and seek medical attention   A healthy diet is rich in fruit, vegetables and whole grains. Poultry fish, nuts and beans are a healthy choice for protein rather then red meat. A low sodium diet and drinking 64 ounces of water daily is generally recommended. Oils and sweet should be limited. Carbohydrates especially for those who are diabetic or overweight, should be limited to 30-45 gram per meal. It is important to eat on a regular schedule, at least 3 times daily. Snacks should be primarily fruits, vegetables or nuts.  Labs this week Friday, NEEDED please

## 2015-02-17 ENCOUNTER — Other Ambulatory Visit: Payer: Self-pay | Admitting: Family Medicine

## 2015-02-17 LAB — CBC
HCT: 39.8 % (ref 36.0–46.0)
Hemoglobin: 13.5 g/dL (ref 12.0–15.0)
MCH: 32.5 pg (ref 26.0–34.0)
MCHC: 33.9 g/dL (ref 30.0–36.0)
MCV: 95.9 fL (ref 78.0–100.0)
Platelets: 193 10*3/uL (ref 150–400)
RBC: 4.15 MIL/uL (ref 3.87–5.11)
RDW: 12.7 % (ref 11.5–15.5)
WBC: 7.1 10*3/uL (ref 4.0–10.5)

## 2015-02-18 LAB — HEMOGLOBIN A1C
Hgb A1c MFr Bld: 5.6 % (ref <5.7)
Mean Plasma Glucose: 114 mg/dL (ref <117)

## 2015-02-18 LAB — BASIC METABOLIC PANEL
BUN: 12 mg/dL (ref 6–23)
CALCIUM: 9.6 mg/dL (ref 8.4–10.5)
CHLORIDE: 101 meq/L (ref 96–112)
CO2: 28 meq/L (ref 19–32)
Creat: 0.77 mg/dL (ref 0.50–1.10)
Glucose, Bld: 91 mg/dL (ref 70–99)
Potassium: 4.1 mEq/L (ref 3.5–5.3)
Sodium: 137 mEq/L (ref 135–145)

## 2015-02-18 LAB — LIPID PANEL
Cholesterol: 189 mg/dL (ref 0–200)
HDL: 64 mg/dL
LDL Cholesterol: 111 mg/dL — ABNORMAL HIGH (ref 0–99)
Total CHOL/HDL Ratio: 3 ratio
Triglycerides: 71 mg/dL
VLDL: 14 mg/dL (ref 0–40)

## 2015-02-18 LAB — VITAMIN D 25 HYDROXY (VIT D DEFICIENCY, FRACTURES): Vit D, 25-Hydroxy: 16 ng/mL — ABNORMAL LOW (ref 30–100)

## 2015-02-18 LAB — TSH: TSH: 5.363 u[IU]/mL — AB (ref 0.350–4.500)

## 2015-02-18 LAB — HIV ANTIBODY (ROUTINE TESTING W REFLEX): HIV: NONREACTIVE

## 2015-02-22 LAB — T3, FREE: T3 FREE: 3.4 pg/mL (ref 2.3–4.2)

## 2015-02-22 LAB — T4, FREE: Free T4: 0.92 ng/dL (ref 0.80–1.80)

## 2015-02-24 ENCOUNTER — Telehealth: Payer: Self-pay | Admitting: Family Medicine

## 2015-02-24 MED ORDER — TRIAMTERENE-HCTZ 37.5-25 MG PO TABS: ORAL_TABLET | ORAL | Status: DC

## 2015-02-24 NOTE — Telephone Encounter (Signed)
Med sent in.

## 2015-03-11 ENCOUNTER — Telehealth: Payer: Self-pay | Admitting: Family Medicine

## 2015-03-11 DIAGNOSIS — E785 Hyperlipidemia, unspecified: Secondary | ICD-10-CM

## 2015-03-11 DIAGNOSIS — R7989 Other specified abnormal findings of blood chemistry: Secondary | ICD-10-CM | POA: Insufficient documentation

## 2015-03-11 DIAGNOSIS — I10 Essential (primary) hypertension: Secondary | ICD-10-CM

## 2015-03-11 NOTE — Progress Notes (Signed)
Subjective:    Patient ID: Caitlyn Patterson, female    DOB: 07-22-1961, 54 y.o.   MRN: [id number]  HPI The PT is here for follow up and re-evaluation of chronic medical conditions, medication management and review of any available recent lab and radiology data.  Preventive health is updated, specifically  Cancer screening and Immunization.   Questions or concerns regarding consultations or procedures which the PT has had in the interim are  addressed. The PT denies any adverse reactions to current medications since the last visit.  There are no new concerns.  There are no specific complaints       Review of Systems See HPI Denies recent fever or chills. Denies sinus pressure, nasal congestion, ear pain or sore throat. Denies chest congestion, productive cough or wheezing. Denies chest pains, palpitations and leg swelling Denies abdominal pain, nausea, vomiting,diarrhea or constipation.   Denies dysuria, frequency, hesitancy or incontinence. Denies joint pain, swelling and limitation in mobility. Denies headaches, seizures, numbness, or tingling. Denies depression, anxiety or insomnia. Denies skin break down or rash.        Objective:   Physical Exam  BP 122/82 mmHg  Pulse 72  Resp 16  Ht 5\' 5"  (1.651 m)  Wt 187 lb (84.823 kg)  BMI 31.12 kg/m2  SpO2 98%  LMP 07/23/2013 Patient alert and oriented and in no cardiopulmonary distress.  HEENT: No facial asymmetry, EOMI,   oropharynx pink and moist.  Neck supple no JVD, no mass.  Chest: Clear to auscultation bilaterally.  CVS: S1, S2 no murmurs, no S3.Regular rate.  ABD: Soft non tender.   Ext: No edema  MS: Adequate ROM spine, shoulders, hips and knees.  Skin: Intact, no ulcerations or rash noted.  Psych: Good eye contact, normal affect. Memory intact not anxious or depressed appearing.  CNS: CN 2-12 intact, power,  normal throughout.no focal deficits noted.       Assessment & Plan:  Essential  hypertension Controlled, no change in medication DASH diet and commitment to daily physical activity for a minimum of 30 minutes discussed and encouraged, as a part of hypertension management. The importance of attaining a healthy weight is also discussed.  BP/Weight 02/14/2015 12/01/2014 09/27/2014 07/15/2014 10/22/2013 8/54/6270 01/21/92  Systolic BP 818 299 371 696 789 381 017  Diastolic BP 82 84 70 72 78 80 70  Wt. (Lbs) 187 189.9 189.08 187 188.8 189 190  BMI 31.12 31.6 31.46 31.12 30.93 31.45 30.68         Vitamin D deficiency Updated lab needed at/ before next visit. Needs weekly supplement, has not complied in the past , will need to f/u on this   Obesity (BMI 30.0-34.9) Unchanged Patient re-educated about  the importance of commitment to a  minimum of 150 minutes of exercise per week.  The importance of healthy food choices with portion control discussed. Encouraged to start a food diary, count calories and to consider  joining a support group. Sample diet sheets offered. Goals set by the patient for the next several months.   Weight /BMI 02/14/2015 12/01/2014 09/27/2014  WEIGHT 187 lb 189 lb 14.4 oz 189 lb 1.3 oz  HEIGHT 5\' 5"  5\' 5"  5\' 5"   BMI 31.12 kg/m2 31.6 kg/m2 31.46 kg/m2    Current exercise per week 120 minutes.    Dyslipidemia Hyperlipidemia:Low fat diet discussed and encouraged.   Lipid Panel  Lab Results  Component Value Date   CHOL 189 02/17/2015   HDL 64 02/17/2015  LDLCALC 111* 02/17/2015   TRIG 71 02/17/2015   CHOLHDL 3.0 02/17/2015      Updated lab needed at/ before next visit.    Abnormal TSH Normal free T3 and free T4 , asymptomatic, will rept TSH in 6 months

## 2015-03-11 NOTE — Assessment & Plan Note (Signed)
Normal free T3 and free T4 , asymptomatic, will rept TSH in 6 months

## 2015-03-11 NOTE — Assessment & Plan Note (Signed)
Controlled, no change in medication DASH diet and commitment to daily physical activity for a minimum of 30 minutes discussed and encouraged, as a part of hypertension management. The importance of attaining a healthy weight is also discussed.  BP/Weight 02/14/2015 12/01/2014 09/27/2014 07/15/2014 10/22/2013 05/06/121 12/22/1144  Systolic BP 431 427 670 110 034 961 164  Diastolic BP 82 84 70 72 78 80 70  Wt. (Lbs) 187 189.9 189.08 187 188.8 189 190  BMI 31.12 31.6 31.46 31.12 30.93 31.45 30.68

## 2015-03-11 NOTE — Telephone Encounter (Signed)
Pls call pt re labs done early April Please  advise vit D is low, and erx Vit D3 50,000 IU once weekly #4 refill 5, and let pt know .    Bad cholesterol is higher than it should be , needs to follow low fat diet Let her know  I will need to keep a check onher thyroid thyroid function ma, as the test SUGGESTS may become underactive ,will rept blood test in 6 month She will need fasting lipid, TSH , chem 7 and TSH 1 week before her 6 month f/u lab, pls order and let her know

## 2015-03-11 NOTE — Assessment & Plan Note (Signed)
Hyperlipidemia:Low fat diet discussed and encouraged.   Lipid Panel  Lab Results  Component Value Date   CHOL 189 02/17/2015   HDL 64 02/17/2015   LDLCALC 111* 02/17/2015   TRIG 71 02/17/2015   CHOLHDL 3.0 02/17/2015      Updated lab needed at/ before next visit.

## 2015-03-11 NOTE — Assessment & Plan Note (Signed)
Updated lab needed at/ before next visit. Needs weekly supplement, has not complied in the past , will need to f/u on this

## 2015-03-11 NOTE — Assessment & Plan Note (Signed)
Unchanged Patient re-educated about  the importance of commitment to a  minimum of 150 minutes of exercise per week.  The importance of healthy food choices with portion control discussed. Encouraged to start a food diary, count calories and to consider  joining a support group. Sample diet sheets offered. Goals set by the patient for the next several months.   Weight /BMI 02/14/2015 12/01/2014 09/27/2014  WEIGHT 187 lb 189 lb 14.4 oz 189 lb 1.3 oz  HEIGHT 5\' 5"  5\' 5"  5\' 5"   BMI 31.12 kg/m2 31.6 kg/m2 31.46 kg/m2    Current exercise per week 120 minutes.

## 2015-03-20 NOTE — Telephone Encounter (Signed)
Called and left message for patient to return call.  

## 2015-03-21 ENCOUNTER — Other Ambulatory Visit: Payer: Self-pay

## 2015-03-21 MED ORDER — VITAMIN D (ERGOCALCIFEROL) 1.25 MG (50000 UNIT) PO CAPS: 50000.0000 [IU] | ORAL_CAPSULE | ORAL | Status: DC

## 2015-03-21 NOTE — Addendum Note (Signed)
Addended by: Denman George B on: 03/21/2015 09:09 AM   Modules accepted: Orders

## 2015-03-21 NOTE — Telephone Encounter (Signed)
Patient aware of results.  States that she will have labs done before October appointment.  Lab order and next appointment mailed to patient.  Medication sent to requested pharmacy.

## 2015-06-01 ENCOUNTER — Other Ambulatory Visit: Payer: Self-pay | Admitting: Family Medicine

## 2015-09-07 ENCOUNTER — Encounter: Payer: Self-pay | Admitting: Adult Health

## 2015-09-07 ENCOUNTER — Encounter: Payer: Self-pay | Admitting: Family Medicine

## 2015-09-07 ENCOUNTER — Ambulatory Visit (INDEPENDENT_AMBULATORY_CARE_PROVIDER_SITE_OTHER): Payer: 59 | Admitting: Family Medicine

## 2015-09-07 ENCOUNTER — Ambulatory Visit (HOSPITAL_COMMUNITY)
Admission: RE | Admit: 2015-09-07 | Discharge: 2015-09-07 | Disposition: A | Discharge status: Home / Self Care | Payer: 59 | Source: Ambulatory Visit | Attending: Family Medicine | Admitting: Family Medicine

## 2015-09-07 ENCOUNTER — Ambulatory Visit (INDEPENDENT_AMBULATORY_CARE_PROVIDER_SITE_OTHER): Payer: 59 | Admitting: Adult Health

## 2015-09-07 VITALS — BP 116/82 | HR 62 | Ht 65.25 in | Wt 180.0 lb

## 2015-09-07 VITALS — BP 110/80 | HR 61 | Resp 16 | Ht 65.0 in | Wt 180.0 lb

## 2015-09-07 DIAGNOSIS — Z23 Encounter for immunization: Secondary | ICD-10-CM | POA: Diagnosis not present

## 2015-09-07 DIAGNOSIS — E785 Hyperlipidemia, unspecified: Secondary | ICD-10-CM | POA: Diagnosis not present

## 2015-09-07 DIAGNOSIS — Z1211 Encounter for screening for malignant neoplasm of colon: Secondary | ICD-10-CM

## 2015-09-07 DIAGNOSIS — M25511 Pain in right shoulder: Secondary | ICD-10-CM

## 2015-09-07 DIAGNOSIS — I10 Essential (primary) hypertension: Secondary | ICD-10-CM | POA: Diagnosis not present

## 2015-09-07 DIAGNOSIS — E559 Vitamin D deficiency, unspecified: Secondary | ICD-10-CM

## 2015-09-07 DIAGNOSIS — Z01419 Encounter for gynecological examination (general) (routine) without abnormal findings: Secondary | ICD-10-CM

## 2015-09-07 DIAGNOSIS — Z1159 Encounter for screening for other viral diseases: Secondary | ICD-10-CM

## 2015-09-07 DIAGNOSIS — R7989 Other specified abnormal findings of blood chemistry: Secondary | ICD-10-CM

## 2015-09-07 LAB — HEMOCCULT GUIAC POC 1CARD (OFFICE): Fecal Occult Blood, POC: NEGATIVE

## 2015-09-07 MED ORDER — VITAMIN D (ERGOCALCIFEROL) 1.25 MG (50000 UNIT) PO CAPS: 50000.0000 [IU] | ORAL_CAPSULE | ORAL | Status: DC

## 2015-09-07 MED ORDER — PREDNISONE 5 MG PO TABS: 5.0000 mg | ORAL_TABLET | Freq: Two times a day (BID) | ORAL | Status: AC

## 2015-09-07 NOTE — Progress Notes (Signed)
Patient ID: Caitlyn Patterson, female   DOB: [birth year], 54 y.o.   MRN: [id number] History of Present Illness: Caitlyn Patterson is a 54 year old black female in for a well woman gyn exam,she had a normal pap with negative HPV 04/20/13. PCP is Dr Moshe Cipro.  Current Medications, Allergies, Past Medical History, Past Surgical History, Family History and Social History were reviewed in [employer] record.     Review of Systems: Patient denies any headaches, hearing loss, fatigue, blurred vision, shortness of breath, chest pain, abdominal pain, problems with bowel movements, urination, or intercourse. No mood swings.Has some pain in right knee at times.    Physical Exam:BP 116/82 mmHg  Pulse 62  Ht 5' 5.25" (1.657 m)  Wt 180 lb (81.647 kg)  BMI 29.74 kg/m2  LMP 07/23/2013 General:  Well developed, well nourished, no acute distress Skin:  Warm and dry Neck:  Midline trachea, normal thyroid, good ROM, no lymphadenopathy Lungs; Clear to auscultation bilaterally Breast:  No dominant palpable mass, retraction, or nipple discharge Cardiovascular: Regular rate and rhythm Abdomen:  Soft, non tender, no hepatosplenomegaly Pelvic:  External genitalia is normal in appearance, no lesions.  The vagina is normal in appearance. Urethra has no lesions or masses. The cervix is smooth.  Uterus is felt to be normal size, shape, and contour.  No adnexal masses or tenderness noted.Bladder is non tender, no masses felt. Rectal: Good sphincter tone, no polyps, or hemorrhoids felt.  Hemoccult negative. Extremities/musculoskeletal:  No swelling or varicosities noted, no clubbing or cyanosis, no tenderness or movement in right knee  Psych:  No mood changes, alert and cooperative,seems happy   Impression: Well woman gyn exam no pap   Plan: Try ice and advil for knee Get flu shot Pap and physical in 1 year Mammogram yearly Colonoscopy 2020 Labs with PCP

## 2015-09-07 NOTE — Patient Instructions (Addendum)
F/u 2nd week in April, call if you need me sooner  Fasting labs April 3 or after  Continue weekly vit D, this will be renewed   Pls get xray of right shoulder, and use tylenol as needed as currently doing, with severe flare may use prednisone sent in   CONGRATS  On 7 pound weight loss   . Thanks for choosing [facility], we consider it a privelige to serve you.  Please work on good  health habits so that your health will improve. 1. Commitment to daily physical activity for 30 to 60  minutes, if you are able to do this.  2. Commitment to wise food choices. Aim for half of your  food intake to be vegetable and fruit, one quarter starchy foods, and one quarter protein. Try to eat on a regular schedule  3 meals per day, snacking between meals should be limited to vegetables or fruits or small portions of nuts. 64 ounces of water per day is generally recommended, unless you have specific health conditions, like heart failure or kidney failure where you will need to limit fluid intake.  3. Commitment to sufficient and a  good quality of physical and mental rest daily, generally between 6 to 8 hours per day.  WITH PERSISTANCE AND PERSEVERANCE, THE IMPOSSIBLE , BECOMES THE NORM!  Flu vaccine today

## 2015-09-07 NOTE — Assessment & Plan Note (Signed)
Deteriorated. Patient re-educated about  the importance of commitment to a  minimum of 150 minutes of exercise per week.  The importance of healthy food choices with portion control discussed. Encouraged to start a food diary, count calories and to consider  joining a support group. Sample diet sheets offered. Goals set by the patient for the next several months.   Weight /BMI 09/07/2015 09/07/2015 02/14/2015  WEIGHT 180 lb 180 lb 187 lb  HEIGHT 5' 5.25" 5\' 5"  5\' 5"   BMI 29.74 kg/m2 29.95 kg/m2 31.12 kg/m2    Current exercise per week 90 minutes.

## 2015-09-07 NOTE — Assessment & Plan Note (Signed)
Hyperlipidemia:Low fat diet discussed and encouraged.   Lipid Panel  Lab Results  Component Value Date   CHOL 189 02/17/2015   HDL 64 02/17/2015   LDLCALC 111* 02/17/2015   TRIG 71 02/17/2015   CHOLHDL 3.0 02/17/2015      Updated lab needed at/ before next visit.  

## 2015-09-07 NOTE — Progress Notes (Signed)
COREN CROWNOVER     MRN: [id number]      DOB: 07/17/1961   HPI Ms. Campton is here for follow up and re-evaluation of chronic medical conditions, medication management and review of any available recent lab and radiology data.  Preventive health is updated, specifically  Cancer screening and Immunization.   Questions or concerns regarding consultations or procedures which the PT has had in the interim are  addressed. The PT denies any adverse reactions to current medications since the last visit.  1 month h/o intermittent right shoulder pain limiting movement , on avg 3 episodes this month, max pain level a 6   ROS Denies recent fever or chills. Denies sinus pressure, nasal congestion, ear pain or sore throat. Denies chest congestion, productive cough or wheezing. Denies chest pains, palpitations and leg swelling Denies abdominal pain, nausea, vomiting,diarrhea or constipation.   Denies dysuria, frequency, hesitancy or incontinence. Denies joint pain, swelling and limitation in mobility. Denies headaches, seizures, numbness, or tingling. Denies depression, anxiety or insomnia. Denies skin break down or rash.   PE  BP 110/80 mmHg  Pulse 61  Resp 16  Ht 5\' 5"  (1.651 m)  Wt 180 lb (81.647 kg)  BMI 29.95 kg/m2  SpO2 100%  LMP 07/23/2013  Patient alert and oriented and in no cardiopulmonary distress.  HEENT: No facial asymmetry, EOMI,   oropharynx pink and moist.  Neck supple no JVD, no mass.  Chest: Clear to auscultation bilaterally.  CVS: S1, S2 no murmurs, no S3.Regular rate.  ABD: Soft non tender.   Ext: No edema  MS: Adequate ROM spine, shoulders, hips and knees.  Skin: Intact, no ulcerations or rash noted.  Psych: Good eye contact, normal affect. Memory intact not anxious or depressed appearing.  CNS: CN 2-12 intact, power,  normal throughout.no focal deficits noted.   Assessment & Plan   Essential hypertension Controlled, no change in medication DASH  diet and commitment to daily physical activity for a minimum of 30 minutes discussed and encouraged, as a part of hypertension management. The importance of attaining a healthy weight is also discussed.  BP/Weight 09/07/2015 09/07/2015 02/14/2015 12/01/2014 09/27/2014 07/15/2014 50/0/9381  Systolic BP 829 937 169 678 938 101 751  Diastolic BP 82 80 82 84 70 72 78  Wt. (Lbs) 180 180 187 189.9 189.08 187 188.8  BMI 29.74 29.95 31.12 31.6 31.46 31.12 30.93        Overweight (BMI 25.0-29.9) Deteriorated. Patient re-educated about  the importance of commitment to a  minimum of 150 minutes of exercise per week.  The importance of healthy food choices with portion control discussed. Encouraged to start a food diary, count calories and to consider  joining a support group. Sample diet sheets offered. Goals set by the patient for the next several months.   Weight /BMI 09/07/2015 09/07/2015 02/14/2015  WEIGHT 180 lb 180 lb 187 lb  HEIGHT 5' 5.25" 5\' 5"  5\' 5"   BMI 29.74 kg/m2 29.95 kg/m2 31.12 kg/m2    Current exercise per week 90 minutes.   Shoulder pain, right 1 month h/o increased right shoulder pain limiting activity, on avg 3 episodes per month, uses shoulder on the job  Dyslipidemia Hyperlipidemia:Low fat diet discussed and encouraged.   Lipid Panel  Lab Results  Component Value Date   CHOL 189 02/17/2015   HDL 64 02/17/2015   LDLCALC 111* 02/17/2015   TRIG 71 02/17/2015   CHOLHDL 3.0 02/17/2015   Updated lab needed at/ before next visit.  Vitamin D deficiency Resume monthly medication Updated lab needed at/ before next visit.

## 2015-09-07 NOTE — Assessment & Plan Note (Signed)
Resume monthly medication Updated lab needed at/ before next visit.

## 2015-09-07 NOTE — Assessment & Plan Note (Signed)
1 month h/o increased right shoulder pain limiting activity, on avg 3 episodes per month, uses shoulder on the job

## 2015-09-07 NOTE — Assessment & Plan Note (Signed)
Controlled, no change in medication DASH diet and commitment to daily physical activity for a minimum of 30 minutes discussed and encouraged, as a part of hypertension management. The importance of attaining a healthy weight is also discussed.  BP/Weight 09/07/2015 09/07/2015 02/14/2015 12/01/2014 09/27/2014 07/15/2014 19/0/1222  Systolic BP 411 464 314 276 701 100 349  Diastolic BP 82 80 82 84 70 72 78  Wt. (Lbs) 180 180 187 189.9 189.08 187 188.8  BMI 29.74 29.95 31.12 31.6 31.46 31.12 30.93

## 2015-09-07 NOTE — Patient Instructions (Signed)
Pap and physical in  1year Mammogram yearly Colonoscopy 2020 Labs with PCP  Get flu shot  Try ice and advil for knee

## 2015-09-13 ENCOUNTER — Telehealth: Payer: Self-pay

## 2015-09-13 DIAGNOSIS — M25511 Pain in right shoulder: Secondary | ICD-10-CM

## 2015-09-13 NOTE — Telephone Encounter (Signed)
Referral entered  

## 2015-09-13 NOTE — Telephone Encounter (Signed)
-----   Message from Fayrene Helper, MD sent at 09/13/2015  5:52 AM EDT ----- pls refer to dr Marlene Lard will sign

## 2015-09-19 ENCOUNTER — Telehealth: Payer: Self-pay | Admitting: Adult Health

## 2015-09-19 NOTE — Telephone Encounter (Signed)
Has pain in bottom of stomach and can't hold her urine for about a week,to come tomorrow

## 2015-09-20 ENCOUNTER — Encounter: Payer: Self-pay | Admitting: Adult Health

## 2015-09-20 ENCOUNTER — Ambulatory Visit (INDEPENDENT_AMBULATORY_CARE_PROVIDER_SITE_OTHER): Payer: 59 | Admitting: Adult Health

## 2015-09-20 VITALS — BP 110/78 | HR 70 | Ht 65.0 in | Wt 181.0 lb

## 2015-09-20 DIAGNOSIS — R309 Painful micturition, unspecified: Secondary | ICD-10-CM

## 2015-09-20 DIAGNOSIS — R319 Hematuria, unspecified: Secondary | ICD-10-CM

## 2015-09-20 HISTORY — DX: Hematuria, unspecified: R31.9

## 2015-09-20 HISTORY — DX: Painful micturition, unspecified: R30.9

## 2015-09-20 LAB — POCT URINALYSIS DIPSTICK
Glucose, UA: NEGATIVE
Nitrite, UA: NEGATIVE
PROTEIN UA: NEGATIVE

## 2015-09-20 MED ORDER — SULFAMETHOXAZOLE-TRIMETHOPRIM 800-160 MG PO TABS: 1.0000 | ORAL_TABLET | Freq: Two times a day (BID) | ORAL | Status: DC

## 2015-09-20 NOTE — Patient Instructions (Signed)
Hematuria, Adult Hematuria is blood in your urine. It can be caused by a bladder infection, kidney infection, prostate infection, kidney stone, or cancer of your urinary tract. Infections can usually be treated with medicine, and a kidney stone usually will pass through your urine. If neither of these is the cause of your hematuria, further workup to find out the reason may be needed. It is very important that you tell your health care provider about any blood you see in your urine, even if the blood stops without treatment or happens without causing pain. Blood in your urine that happens and then stops and then happens again can be a symptom of a very serious condition. Also, pain is not a symptom in the initial stages of many urinary cancers. HOME CARE INSTRUCTIONS   Drink lots of fluid, 3-4 quarts a day. If you have been diagnosed with an infection, cranberry juice is especially recommended, in addition to large amounts of water.  Avoid caffeine, tea, and carbonated beverages because they tend to irritate the bladder.  Avoid alcohol because it may irritate the prostate.  Take all medicines as directed by your health care provider.  If you were prescribed an antibiotic medicine, finish it all even if you start to feel better.  If you have been diagnosed with a kidney stone, follow your health care provider's instructions regarding straining your urine to catch the stone.  Empty your bladder often. Avoid holding urine for long periods of time.  After a bowel movement, women should cleanse front to back. Use each tissue only once.  Empty your bladder before and after sexual intercourse if you are a female. SEEK MEDICAL CARE IF:  You develop back pain.  You have a fever.  You have a feeling of sickness in your stomach (nausea) or vomiting.  Your symptoms are not better in 3 days. Return sooner if you are getting worse. SEEK IMMEDIATE MEDICAL CARE IF:   You develop severe vomiting and  are unable to keep the medicine down.  You develop severe back or abdominal pain despite taking your medicines.  You begin passing a large amount of blood or clots in your urine.  You feel extremely weak or faint, or you pass out. MAKE SURE YOU:   Understand these instructions.  Will watch your condition.  Will get help right away if you are not doing well or get worse.   This information is not intended to replace advice given to you by your health care provider. Make sure you discuss any questions you have with your health care provider.   Document Released: 11/04/2005 Document Revised: 11/25/2014 Document Reviewed: 07/05/2013 Elsevier Interactive Patient Education 2016 [place] fluids and take septra ds Follow up prn

## 2015-09-20 NOTE — Progress Notes (Signed)
Subjective:     Patient ID: Caitlyn Patterson, female   DOB: 1961/11/14, 54 y.o.   MRN: [id number]  HPI Caitlyn Patterson is a 54 year old black female in complaining of pain in lower abdomen and pain with urination and has urge incontinence at times.  Review of Systems Patient denies any headaches, hearing loss, fatigue, blurred vision, shortness of breath, chest pain,  problems with bowel movements, or intercourse. No joint pain or mood swings. See HPI for positives.  Reviewed past medical,surgical, social and family history. Reviewed medications and allergies.     Objective:   Physical Exam BP 110/78 mmHg  Pulse 70  Ht 5\' 5"  (1.651 m)  Wt 181 lb (82.101 kg)  BMI 30.12 kg/m2  LMP 07/23/2013 urine dipstick 2+ leuks,trace blood,Skin warm and dry.Pelvic: external genitalia is normal in appearance no lesions, vagina: scant discharge without odor,urethra has no lesions or masses noted, cervix:smooth, uterus: normal size, shape and contour, non tender, no masses felt, adnexa: no masses or tenderness noted. Bladder is mildly tender and no masses felt. No CVAT noted,she delines pyridium at this time..   Assessment:   Pain with urination Hematuria    Plan:    UA C&S sent Rx septra ds 1 bid x 7 days Push water Follow up prn

## 2015-09-22 LAB — URINALYSIS, ROUTINE W REFLEX MICROSCOPIC
BILIRUBIN UA: NEGATIVE
Glucose, UA: NEGATIVE
Ketones, UA: NEGATIVE
Nitrite, UA: POSITIVE — AB
PH UA: 6 (ref 5.0–7.5)
RBC UA: NEGATIVE
Specific Gravity, UA: 1.023 (ref 1.005–1.030)
UUROB: 0.2 mg/dL (ref 0.2–1.0)

## 2015-09-22 LAB — MICROSCOPIC EXAMINATION: CASTS: NONE SEEN /LPF

## 2015-09-23 LAB — URINE CULTURE

## 2015-09-25 ENCOUNTER — Telehealth: Payer: Self-pay | Admitting: Adult Health

## 2015-09-25 MED ORDER — NITROFURANTOIN MONOHYD MACRO 100 MG PO CAPS: 100.0000 mg | ORAL_CAPSULE | Freq: Two times a day (BID) | ORAL | Status: DC

## 2015-09-25 NOTE — Telephone Encounter (Signed)
Pt aware of culture,stop septra ds and will rx macrobid

## 2015-09-26 ENCOUNTER — Ambulatory Visit: Payer: 59 | Admitting: Adult Health

## 2015-09-26 ENCOUNTER — Encounter: Payer: Self-pay | Admitting: Adult Health

## 2015-09-27 ENCOUNTER — Telehealth: Payer: Self-pay | Admitting: Adult Health

## 2015-09-27 NOTE — Telephone Encounter (Signed)
Pt had some blood in stool yesterday after hard BM,no pain,watch for day or so if happens again,make appt

## 2015-12-29 ENCOUNTER — Other Ambulatory Visit: Payer: Self-pay | Admitting: Family Medicine

## 2015-12-29 DIAGNOSIS — Z1231 Encounter for screening mammogram for malignant neoplasm of breast: Secondary | ICD-10-CM

## 2016-01-24 ENCOUNTER — Other Ambulatory Visit: Payer: Self-pay | Admitting: Family Medicine

## 2016-01-25 ENCOUNTER — Ambulatory Visit (HOSPITAL_COMMUNITY)
Admission: RE | Admit: 2016-01-25 | Discharge: 2016-01-25 | Disposition: A | Discharge status: Home / Self Care | Payer: 59 | Source: Ambulatory Visit | Attending: Family Medicine | Admitting: Family Medicine

## 2016-01-25 DIAGNOSIS — Z1231 Encounter for screening mammogram for malignant neoplasm of breast: Secondary | ICD-10-CM | POA: Insufficient documentation

## 2016-02-22 ENCOUNTER — Other Ambulatory Visit: Payer: Self-pay | Admitting: Family Medicine

## 2016-03-07 ENCOUNTER — Ambulatory Visit: Payer: 59 | Admitting: Family Medicine

## 2016-03-08 ENCOUNTER — Encounter (HOSPITAL_COMMUNITY): Payer: Self-pay

## 2016-03-08 ENCOUNTER — Emergency Department (HOSPITAL_COMMUNITY)
Admission: EM | Admit: 2016-03-08 | Discharge: 2016-03-08 | Disposition: A | Discharge status: Home / Self Care | Payer: 59 | Attending: Emergency Medicine | Admitting: Emergency Medicine

## 2016-03-08 DIAGNOSIS — R5383 Other fatigue: Secondary | ICD-10-CM

## 2016-03-08 DIAGNOSIS — I959 Hypotension, unspecified: Secondary | ICD-10-CM | POA: Diagnosis present

## 2016-03-08 DIAGNOSIS — R531 Weakness: Secondary | ICD-10-CM

## 2016-03-08 LAB — CBC WITH DIFFERENTIAL/PLATELET
BASOS ABS: 0 10*3/uL (ref 0.0–0.1)
BASOS PCT: 0 %
EOS PCT: 1 %
Eosinophils Absolute: 0.1 10*3/uL (ref 0.0–0.7)
HEMATOCRIT: 38.5 % (ref 36.0–46.0)
Hemoglobin: 13 g/dL (ref 12.0–15.0)
Lymphocytes Relative: 19 %
Lymphs Abs: 1.6 10*3/uL (ref 0.7–4.0)
MCH: 32.5 pg (ref 26.0–34.0)
MCHC: 33.8 g/dL (ref 30.0–36.0)
MCV: 96.3 fL (ref 78.0–100.0)
MONO ABS: 0.5 10*3/uL (ref 0.1–1.0)
Monocytes Relative: 5 %
NEUTROS ABS: 6.3 10*3/uL (ref 1.7–7.7)
Neutrophils Relative %: 75 %
PLATELETS: 162 10*3/uL (ref 150–400)
RBC: 4 MIL/uL (ref 3.87–5.11)
RDW: 11.8 % (ref 11.5–15.5)
WBC: 8.4 10*3/uL (ref 4.0–10.5)

## 2016-03-08 LAB — BASIC METABOLIC PANEL
Anion gap: 11 (ref 5–15)
BUN: 20 mg/dL (ref 6–20)
CALCIUM: 9.6 mg/dL (ref 8.9–10.3)
CO2: 26 mmol/L (ref 22–32)
CREATININE: 0.91 mg/dL (ref 0.44–1.00)
Chloride: 103 mmol/L (ref 101–111)
GFR calc Af Amer: >60 mL/min (ref >60)
GLUCOSE: 101 mg/dL — AB (ref 65–99)
Potassium: 3.9 mmol/L (ref 3.5–5.1)
Sodium: 140 mmol/L (ref 135–145)

## 2016-03-08 LAB — URINALYSIS, ROUTINE W REFLEX MICROSCOPIC
BILIRUBIN URINE: NEGATIVE
Glucose, UA: NEGATIVE mg/dL
Hgb urine dipstick: NEGATIVE
KETONES UR: NEGATIVE mg/dL
NITRITE: NEGATIVE
PROTEIN: NEGATIVE mg/dL
Specific Gravity, Urine: 1.025 (ref 1.005–1.030)
pH: 5.5 (ref 5.0–8.0)

## 2016-03-08 LAB — URINE MICROSCOPIC-ADD ON: RBC / HPF: NONE SEEN RBC/hpf (ref 0–5)

## 2016-03-08 LAB — TROPONIN I: Troponin I: <0.03 ng/mL (ref <0.031)

## 2016-03-08 MED ORDER — SODIUM CHLORIDE 0.9 % IV BOLUS (SEPSIS)
1000.0000 mL | Freq: Once | INTRAVENOUS | Status: AC
  Administered 2016-03-08: 1000 mL via INTRAVENOUS

## 2016-03-08 NOTE — ED Provider Notes (Signed)
CSN: [id number]     Arrival date & time 03/08/16  [id number] History  By signing my name below, I, Terressa Koyanagi, attest that this documentation has been prepared under the direction and in the presence of Ripley Fraise, MD. Electronically Signed: Terressa Koyanagi, ED Scribe. 03/08/2016. 11:02 AM.  Chief Complaint  Patient presents with  . Hypotension   The history is provided by the patient. No language interpreter was used.   PCP: Tula Nakayama, MD HPI Comments: Caitlyn Patterson is a 55 y.o. female, with PMHx noted below including HTN, who presents to the Emergency Department complaining of hypotension onset 1.5 weeks ago. Associated Sx include fatigue, nausea, diaphoresis, headache, loss of appetite and dizziness. Pt reports she checked her BP this morning and it was 69/35; pt reports at baseline her systolic blood pressure is around 120s. Pt also reports her dizziness worsened this morning when she went to a standing position. Pt denies any medicinal changes. Pt denies fever, vomiting, chest pain, abd pain, visual loss/blurred vision, weakness, slurred speech, diarrhea, blood in urine. Pt denies any Hx of MI or stroke.  Pt also denies any recent dietary changes.   Past Medical History  Diagnosis Date  . Fibroid   . Hypertension   . BV (bacterial vaginosis)   . Rectocele 04/20/2013  . Yeast vaginitis 08/06/2013  . Vaginal dryness 07/15/2014  . Menopause 07/15/2014  . Pain with urination 09/20/2015  . Hematuria 09/20/2015   Past Surgical History  Procedure Laterality Date  . No past surgeries     Family History  Problem Relation Age of Onset  . Cancer Maternal Grandmother     colon   . Stroke Mother   . Hypertension Mother   . Cancer Maternal Aunt     breast  . Pneumonia Father   . Hypertension Brother   . Hypertension Sister   . Varicose Veins Sister   . Diabetes Paternal Aunt    Social History  Substance Use Topics  . Smoking status: Never Smoker   . Smokeless tobacco: Never  Used  . Alcohol Use: 1.2 - 1.8 oz/week    1-2 Glasses of wine, 1 Shots of liquor per week     Comment: occ   OB History    Gravida Para Term Preterm AB TAB SAB Ectopic Multiple Living   1    1 1          Review of Systems  Constitutional: Positive for diaphoresis, appetite change and fatigue. Negative for fever.  Eyes: Negative for visual disturbance.  Respiratory: Negative for shortness of breath.   Cardiovascular: Negative for chest pain.  Gastrointestinal: Positive for nausea. Negative for vomiting, abdominal pain and diarrhea.  Genitourinary: Negative for hematuria.  Neurological: Positive for dizziness and headaches. Negative for speech difficulty and weakness.  All other systems reviewed and are negative.  Allergies  Amoxicillin and Ibuprofen  Home Medications   Prior to Admission medications   Medication Sig Start Date End Date Taking? Authorizing Provider  nitrofurantoin, macrocrystal-monohydrate, (MACROBID) 100 MG capsule Take 1 capsule (100 mg total) by mouth 2 (two) times daily. 09/25/15   Estill Dooms, NP  sulfamethoxazole-trimethoprim (BACTRIM DS,SEPTRA DS) 800-160 MG tablet Take 1 tablet by mouth 2 (two) times daily. 09/20/15   Estill Dooms, NP  triamterene-hydrochlorothiazide [facility]) 37.5-25 MG tablet Take 1 tablet by mouth  every day 01/25/16   Fayrene Helper, MD  Vitamin D, Ergocalciferol, (DRISDOL) 50000 UNITS CAPS capsule Take 1 capsule (50,000 Units total) by  mouth every 7 (seven) days. 09/07/15   Fayrene Helper, MD   Triage Vitals: BP 107/72 mmHg  Pulse 56  Temp(Src) 97.8 F (36.6 C) (Oral)  Resp 16  Ht 5\' 5"  (1.651 m)  Wt 180 lb (81.647 kg)  BMI 29.95 kg/m2  SpO2 100%  LMP 07/23/2013 Physical Exam CONSTITUTIONAL: Well developed/well nourished HEAD: Normocephalic/atraumatic EYES: EOMI/PERRL ENMT: Mucous membranes moist NECK: supple no meningeal signs SPINE/BACK:entire spine nontender CV: S1/S2 noted, no murmurs/rubs/gallops  noted LUNGS: Lungs are clear to auscultation bilaterally, no apparent distress ABDOMEN: soft, nontender, no rebound or guarding, bowel sounds noted throughout abdomen GU:no cva tenderness NEURO: Pt is awake/alert/appropriate, moves all extremitiesx4.  No facial droop.   EXTREMITIES: pulses normal/equal, full ROM SKIN: warm, color normal PSYCH: no abnormalities of mood noted, alert and oriented to situation  ED Course  Procedures  DIAGNOSTIC STUDIES: Oxygen Saturation is 100% on ra, nl by my interpretation.    COORDINATION OF CARE: 11:00 AM: Discussed treatment plan which includes BP check with positional change, labs, EKG with pt at bedside; patient verbalizes understanding and agrees with treatment plan.   Labs reassuring Pt well appearing She is ambulatory No cp/sob No focal weakness Advised to STOP her BP meds Discussed return precautions Advised PCP f/u next week for BP recheck Pt/family agreeable with plan  Labs Review Labs Reviewed  BASIC METABOLIC PANEL - Abnormal; Notable for the following:    Glucose, Bld 101 (*)    All other components within normal limits  URINALYSIS, ROUTINE W REFLEX MICROSCOPIC (NOT AT [facility]) - Abnormal; Notable for the following:    APPearance HAZY (*)    Leukocytes, UA TRACE (*)    All other components within normal limits  URINE MICROSCOPIC-ADD ON - Abnormal; Notable for the following:    Squamous Epithelial / LPF TOO NUMEROUS TO COUNT (*)    Bacteria, UA MANY (*)    All other components within normal limits  CBC WITH DIFFERENTIAL/PLATELET  TROPONIN I   I have personally reviewed and evaluated these lab results as part of my medical decision-making.   EKG Interpretation   Date/Time:  Friday March 08 2016 11:09:48 EDT Ventricular Rate:  52 PR Interval:  157 QRS Duration: 110 QT Interval:  457 QTC Calculation: 425 R Axis:   60 Text Interpretation:  Sinus rhythm Normal ECG No previous ECGs available  Confirmed by Christy Gentles  MD,  Elenore Rota ([id number]) on 03/08/2016 11:20:01 AM      MDM   Final diagnoses:  Other fatigue  Weakness    Nursing notes including past medical history and social history reviewed and considered in documentation Labs/vital reviewed myself and considered during evaluation   I personally performed the services described in this documentation, which was scribed in my presence. The recorded information has been reviewed and is accurate.       Ripley Fraise, MD 03/08/16 [phone]

## 2016-03-08 NOTE — ED Notes (Signed)
MD at bedside. 

## 2016-03-08 NOTE — Discharge Instructions (Signed)

## 2016-03-08 NOTE — ED Notes (Signed)
Pt states he blood pressure has been low for about a week and a half. States she has not contacted her doctor and is still taking her blood pressure medication. States she did not take her BP med this morning

## 2016-03-11 ENCOUNTER — Telehealth: Payer: Self-pay

## 2016-03-11 NOTE — Telephone Encounter (Signed)
Patient aware and given appointment for 5/3 to follow up from ED and medication management.

## 2016-03-11 NOTE — Telephone Encounter (Signed)
Yet have her take a half dose for now and follow up with PCP as soon as she returns. If she experiences any further dizziness and just hold it until she sees her PCP.

## 2016-03-20 ENCOUNTER — Encounter: Payer: Self-pay | Admitting: Family Medicine

## 2016-03-20 ENCOUNTER — Ambulatory Visit (INDEPENDENT_AMBULATORY_CARE_PROVIDER_SITE_OTHER): Payer: 59 | Admitting: Family Medicine

## 2016-03-20 VITALS — BP 112/80 | HR 60 | Resp 16 | Ht 65.0 in | Wt 184.1 lb

## 2016-03-20 DIAGNOSIS — I10 Essential (primary) hypertension: Secondary | ICD-10-CM

## 2016-03-20 DIAGNOSIS — Z09 Encounter for follow-up examination after completed treatment for conditions other than malignant neoplasm: Secondary | ICD-10-CM

## 2016-03-20 DIAGNOSIS — E785 Hyperlipidemia, unspecified: Secondary | ICD-10-CM | POA: Diagnosis not present

## 2016-03-20 MED ORDER — SPIRONOLACTONE 25 MG PO TABS: 25.0000 mg | ORAL_TABLET | Freq: Every day | ORAL | Status: DC

## 2016-03-20 NOTE — Progress Notes (Signed)
   Subjective:    Patient ID: Caitlyn Patterson, female    DOB: [birth year], 55 y.o.   MRN: [id number]  HPI Pt in for follow up after evaluation at the ED for fatigue and weakness, when she was  found to be hypotensive. Her BP medications was reduced , and she states she feels much improved.Denies fever, chills or symptoms of infection. Had been stabilized on the particular med and dose for years  Review of Systems    See HPI Denies recent fever or chills. Denies sinus pressure, nasal congestion, ear pain or sore throat. Denies chest congestion, productive cough or wheezing. Denies chest pains, palpitations and leg swelling Denies abdominal pain, nausea, vomiting,diarrhea or constipation.   Denies dysuria, frequency, hesitancy or incontinence. Denies joint pain, swelling and limitation in mobility. Denies headaches, seizures, numbness, or tingling. Denies depression, anxiety or insomnia. Denies skin break down or rash.      Objective:   Physical Exam BP 112/80 mmHg  Pulse 60  Resp 16  Ht 5\' 5"  (1.651 m)  Wt 184 lb 1.9 oz (83.516 kg)  BMI 30.64 kg/m2  SpO2 99%  LMP 07/23/2013 Patient alert and oriented and in no cardiopulmonary distress.  HEENT: No facial asymmetry, EOMI,   oropharynx pink and moist.  Neck supple no JVD, no mass.  Chest: Clear to auscultation bilaterally.  CVS: S1, S2 no murmurs, no S3.Regular rate.  ABD: Soft non tender.   Ext: No edema  MS: Adequate ROM spine, shoulders, hips and knees.  Skin: Intact, no ulcerations or rash noted.  Psych: Good eye contact, normal affect. Memory intact not anxious or depressed appearing.  CNS: CN 2-12 intact, power,  normal throughout.no focal deficits noted.        Assessment & Plan:  Encounter for examination following treatment at hospital Pt imptroved following change in medication dosing, still relatively hypotensive, so medication changed to spironolactone, maxzide discontinued  Essential  hypertension Recently over corrcted and symptomatic on lower dose. Start spironolacttone DASH diet and commitment to daily physical activity for a minimum of 30 minutes discussed and encouraged, as a part of hypertension management. The importance of attaining a healthy weight is also discussed.  BP/Weight 03/20/2016 03/08/2016 09/20/2015 09/07/2015 09/07/2015 02/14/2015 [id number]  Systolic BP [id number] 95 A999333 [id number] A999333 [id number]  Diastolic BP 80 64 78 82 80 82 84  Wt. (Lbs) 184.12 180 181 180 180 187 189.9  BMI 30.64 29.95 30.12 29.74 29.95 31.12 31.6      F/u in 6 weeks, call sooner if problems

## 2016-03-20 NOTE — Patient Instructions (Signed)
F/u in 6 weeks call iof you need me further  STOP maxzide and start spironolactone one daily for blood pressure tomorrow, call if problems  Fasting lipid, chem 7 and TSH in 6 weeks

## 2016-04-08 DIAGNOSIS — Z09 Encounter for follow-up examination after completed treatment for conditions other than malignant neoplasm: Secondary | ICD-10-CM | POA: Insufficient documentation

## 2016-04-08 DIAGNOSIS — Z0000 Encounter for general adult medical examination without abnormal findings: Secondary | ICD-10-CM | POA: Insufficient documentation

## 2016-04-08 NOTE — Assessment & Plan Note (Signed)
Pt imptroved following change in medication dosing, still relatively hypotensive, so medication changed to spironolactone, maxzide discontinued

## 2016-04-08 NOTE — Assessment & Plan Note (Signed)
Recently over corrcted and symptomatic on lower dose. Start spironolacttone DASH diet and commitment to daily physical activity for a minimum of 30 minutes discussed and encouraged, as a part of hypertension management. The importance of attaining a healthy weight is also discussed.  BP/Weight 03/20/2016 03/08/2016 09/20/2015 09/07/2015 09/07/2015 02/14/2015 [id number]  Systolic BP [id number] 95 A999333 [id number] A999333 [id number] [id number]  Diastolic BP 80 64 78 82 80 82 84  Wt. (Lbs) 184.12 180 181 180 180 187 189.9  BMI 30.64 29.95 30.12 29.74 29.95 31.12 31.6      F/u in 6 weeks, call sooner if problems

## 2016-04-16 ENCOUNTER — Other Ambulatory Visit: Payer: Self-pay | Admitting: Family Medicine

## 2016-04-16 ENCOUNTER — Other Ambulatory Visit: Payer: Self-pay

## 2016-04-16 DIAGNOSIS — I10 Essential (primary) hypertension: Secondary | ICD-10-CM

## 2016-04-16 MED ORDER — SPIRONOLACTONE 25 MG PO TABS: 25.0000 mg | ORAL_TABLET | Freq: Every day | ORAL | Status: DC

## 2016-05-16 ENCOUNTER — Ambulatory Visit: Payer: 59 | Admitting: Family Medicine

## 2016-06-01 ENCOUNTER — Other Ambulatory Visit: Payer: Self-pay | Admitting: Family Medicine

## 2016-06-01 LAB — BASIC METABOLIC PANEL
BUN: 17 mg/dL (ref 7–25)
CALCIUM: 9.5 mg/dL (ref 8.6–10.4)
CO2: 27 mmol/L (ref 20–31)
CREATININE: 0.88 mg/dL (ref 0.50–1.05)
Chloride: 102 mmol/L (ref 98–110)
Glucose, Bld: 91 mg/dL (ref 65–99)
Potassium: 4.4 mmol/L (ref 3.5–5.3)
Sodium: 138 mmol/L (ref 135–146)

## 2016-06-01 LAB — LIPID PANEL
CHOL/HDL RATIO: 2.5 ratio (ref <=5.0)
CHOLESTEROL: 178 mg/dL (ref 125–200)
HDL: 71 mg/dL (ref >=46)
LDL CALC: 94 mg/dL (ref <130)
TRIGLYCERIDES: 65 mg/dL (ref <150)
VLDL: 13 mg/dL (ref <30)

## 2016-06-01 LAB — TSH: TSH: 3.47 mIU/L

## 2016-06-03 ENCOUNTER — Encounter: Payer: Self-pay | Admitting: Family Medicine

## 2016-06-03 ENCOUNTER — Ambulatory Visit (INDEPENDENT_AMBULATORY_CARE_PROVIDER_SITE_OTHER): Payer: 59 | Admitting: Family Medicine

## 2016-06-03 VITALS — BP 106/60 | HR 60 | Resp 18 | Ht 65.0 in | Wt 187.0 lb

## 2016-06-03 DIAGNOSIS — I10 Essential (primary) hypertension: Secondary | ICD-10-CM | POA: Diagnosis not present

## 2016-06-03 DIAGNOSIS — E669 Obesity, unspecified: Secondary | ICD-10-CM

## 2016-06-03 NOTE — Assessment & Plan Note (Addendum)
Unchanged. Patient re-educated about  the importance of commitment to a  minimum of 150 minutes of exercise per week.  The importance of healthy food choices with portion control discussed. Encouraged to start a food diary, count calories and to consider  joining a support group. Sample diet sheets offered. Goals set by the patient for the next several months.   Weight /BMI 06/03/2016 03/20/2016 03/08/2016  WEIGHT 187 lb 184 lb 1.9 oz 180 lb  HEIGHT 5\' 5"  5\' 5"  5\' 5"   BMI 31.12 kg/m2 30.64 kg/m2 29.95 kg/m2

## 2016-06-03 NOTE — Assessment & Plan Note (Signed)
Over corrected, but pt is asymptomatic.Pt to d/c the med next week and return for nurse BP check 2 weeks after this DASH diet and commitment to daily physical activity for a minimum of 30 minutes discussed and encouraged, as a part of hypertension management. The importance of attaining a healthy weight is also discussed.  BP/Weight 06/03/2016 03/20/2016 03/08/2016 09/20/2015 09/07/2015 09/07/2015 0000000  Systolic BP A999333 XX123456 95 A999333 [id number] A999333 [id number]  Diastolic BP 60 80 64 78 82 80 82  Wt. (Lbs) 187 184.12 180 181 180 180 187  BMI 31.12 30.64 29.95 30.12 29.74 29.95 31.12

## 2016-06-03 NOTE — Patient Instructions (Addendum)
F/u mid December , call if you need me before  Excellent labs  Blood pressure is lower than it needs to be and I believe that you will be able to stop the medication  Continue taking your spironolactone one daily until next weekThursday, July 27,  Stop the spironolactone on July 27  Return for nurse BP check August 10  Pls work on lifestyle change to facilitate weight loss, food choice and regular meals is very important  Pls call gyne, pap seems to be due  Thank you  for choosing [facility]. We consider it a privelige to serve you.  Delivering excellent health care in a caring and  compassionate way is our goal.  Partnering with you,  so that together we can achieve this goal is our strategy.

## 2016-06-03 NOTE — Progress Notes (Signed)
   Caitlyn Patterson     MRN: [id number]      DOB: [birth year]   HPI Caitlyn Patterson is here for follow up and re-evaluation of chronic medical conditions, medication management and review of any available recent lab and radiology data.  Preventive health is updated, specifically  Cancer screening and Immunization.  Needs pap Questions or concerns regarding consultations or procedures which the PT has had in the interim are  addressed. The PT denies any adverse reactions to current medications since the last visit.  There are no new concerns.  There are no specific complaints   ROS Denies recent fever or chills. Denies sinus pressure, nasal congestion, ear pain or sore throat. Denies chest congestion, productive cough or wheezing. Denies chest pains, palpitations and leg swelling Denies abdominal pain, nausea, vomiting,diarrhea or constipation.   Denies dysuria, frequency, hesitancy or incontinence. Denies joint pain, swelling and limitation in mobility. Denies headaches, seizures, numbness, or tingling. Denies depression, anxiety or insomnia. Denies skin break down or rash.   PE  BP 106/60 mmHg  Pulse 60  Resp 18  Ht 5\' 5"  (1.651 m)  Wt 187 lb (84.823 kg)  BMI 31.12 kg/m2  SpO2 98%  LMP 07/23/2013  Patient alert and oriented and in no cardiopulmonary distress.  HEENT: No facial asymmetry, EOMI,   oropharynx pink and moist.  Neck supple no JVD, no mass.  Chest: Clear to auscultation bilaterally.  CVS: S1, S2 no murmurs, no S3.Regular rate.  ABD: Soft non tender.   Ext: No edema  MS: Adequate ROM spine, shoulders, hips and knees.  Skin: Intact, no ulcerations or rash noted.  Psych: Good eye contact, normal affect. Memory intact not anxious or depressed appearing.  CNS: CN 2-12 intact, power,  normal throughout.no focal deficits noted.   Assessment & Plan  Essential hypertension Over corrected, but pt is asymptomatic.Pt to d/c the med next week and return for nurse  BP check 2 weeks after this DASH diet and commitment to daily physical activity for a minimum of 30 minutes discussed and encouraged, as a part of hypertension management. The importance of attaining a healthy weight is also discussed.  BP/Weight 06/03/2016 03/20/2016 03/08/2016 09/20/2015 09/07/2015 09/07/2015 0000000  Systolic BP A999333 [id number] 95 A999333 [id number] A999333 [id number]  Diastolic BP 60 80 64 78 82 80 82  Wt. (Lbs) 187 184.12 180 181 180 180 187  BMI 31.12 30.64 29.95 30.12 29.74 29.95 31.12        Obesity (BMI 30.0-34.9) Unchanged. Patient re-educated about  the importance of commitment to a  minimum of 150 minutes of exercise per week.  The importance of healthy food choices with portion control discussed. Encouraged to start a food diary, count calories and to consider  joining a support group. Sample diet sheets offered. Goals set by the patient for the next several months.   Weight /BMI 06/03/2016 03/20/2016 03/08/2016  WEIGHT 187 lb 184 lb 1.9 oz 180 lb  HEIGHT 5\' 5"  5\' 5"  5\' 5"   BMI 31.12 kg/m2 30.64 kg/m2 29.95 kg/m2

## 2016-06-04 LAB — HEPATITIS C ANTIBODY: HCV AB: NEGATIVE

## 2016-06-24 ENCOUNTER — Telehealth: Payer: Self-pay | Admitting: Family Medicine

## 2016-06-24 NOTE — Telephone Encounter (Signed)
Patient has questions for the nurse regarding her BP medication, please advise

## 2016-06-24 NOTE — Telephone Encounter (Signed)
Cancelled her BP check on the 10th because she was supposed to stop her bp med on July 27th but got confused so she did not. Will stop today and come in Aug 23rd for BP check

## 2016-07-25 ENCOUNTER — Ambulatory Visit: Payer: 59

## 2016-07-25 VITALS — BP 122/80

## 2016-07-25 DIAGNOSIS — I10 Essential (primary) hypertension: Secondary | ICD-10-CM

## 2016-09-05 ENCOUNTER — Ambulatory Visit (INDEPENDENT_AMBULATORY_CARE_PROVIDER_SITE_OTHER): Payer: 59

## 2016-09-05 DIAGNOSIS — Z23 Encounter for immunization: Secondary | ICD-10-CM

## 2016-09-19 ENCOUNTER — Other Ambulatory Visit: Payer: Self-pay | Admitting: Adult Health

## 2016-10-22 ENCOUNTER — Ambulatory Visit: Payer: 59 | Admitting: Family Medicine

## 2016-11-14 ENCOUNTER — Encounter: Payer: Self-pay | Admitting: Family Medicine

## 2016-11-14 ENCOUNTER — Ambulatory Visit (INDEPENDENT_AMBULATORY_CARE_PROVIDER_SITE_OTHER): Payer: 59 | Admitting: Family Medicine

## 2016-11-14 VITALS — BP 120/80 | HR 71 | Resp 16 | Ht 65.0 in | Wt 197.0 lb

## 2016-11-14 DIAGNOSIS — Z01419 Encounter for gynecological examination (general) (routine) without abnormal findings: Secondary | ICD-10-CM | POA: Diagnosis not present

## 2016-11-14 DIAGNOSIS — E66811 Obesity, class 1: Secondary | ICD-10-CM

## 2016-11-14 DIAGNOSIS — I10 Essential (primary) hypertension: Secondary | ICD-10-CM | POA: Insufficient documentation

## 2016-11-14 DIAGNOSIS — E559 Vitamin D deficiency, unspecified: Secondary | ICD-10-CM | POA: Diagnosis not present

## 2016-11-14 DIAGNOSIS — E669 Obesity, unspecified: Secondary | ICD-10-CM

## 2016-11-14 DIAGNOSIS — Z8679 Personal history of other diseases of the circulatory system: Secondary | ICD-10-CM

## 2016-11-14 NOTE — Assessment & Plan Note (Signed)
Normotensive off of medication DASH diet and commitment to daily physical activity for a minimum of 30 minutes discussed and encouraged, as a part of hypertension management. The importance of attaining a healthy weight is also discussed.  BP/Weight 11/14/2016 07/25/2016 06/03/2016 03/20/2016 03/08/2016 09/20/2015 [id number]  Systolic BP 123456 [id number] A999333 XX123456 95 A999333 [id number]  Diastolic BP 80 80 60 80 64 78 82  Wt. (Lbs) 197 - 187 184.12 180 181 180  BMI 32.78 - 31.12 30.64 29.95 30.12 29.74    Weight loss encouraged

## 2016-11-14 NOTE — Progress Notes (Signed)
   Caitlyn Patterson     MRN: [id number]      DOB: 1961/07/28   HPI Caitlyn Patterson is here for follow up and re-evaluation of chronic medical conditions, medication management and review of any available recent lab and radiology data.  Preventive health is updated, specifically  Cancer screening and Immunization.  Pap past due and she is referred Questions or concerns regarding consultations or procedures which the PT has had in the interim are  addressed. The PT denies any adverse reactions to current medications since the last visit.  There are no new concerns.  There are no specific complaints   ROS Denies recent fever or chills. Denies sinus pressure, nasal congestion, ear pain or sore throat. Denies chest congestion, productive cough or wheezing. Denies chest pains, palpitations and leg swelling Denies abdominal pain, nausea, vomiting,diarrhea or constipation.   Denies dysuria, frequency, hesitancy or incontinence. Denies joint pain, swelling and limitation in mobility. Denies headaches, seizures, numbness, or tingling. Denies depression, anxiety or insomnia. Denies skin break down or rash.   PE  BP 120/80   Pulse 71   Resp 16   Ht 5\' 5"  (1.651 m)   Wt 197 lb (89.4 kg)   LMP 07/23/2013   SpO2 99%   BMI 32.78 kg/m    Patient alert and oriented and in no cardiopulmonary distress.  HEENT: No facial asymmetry, EOMI,   oropharynx pink and moist.  Neck supple no JVD, no mass.  Chest: Clear to auscultation bilaterally.  CVS: S1, S2 no murmurs, no S3.Regular rate.  ABD: Soft non tender.   Ext: No edema  MS: Adequate ROM spine, shoulders, hips and knees.  Skin: Intact, no ulcerations or rash noted.  Psych: Good eye contact, normal affect. Memory intact not anxious or depressed appearing.  CNS: CN 2-12 intact, power,  normal throughout.no focal deficits noted.   Assessment & Plan  Obesity (BMI 30.0-34.9) Deteriorated. Patient re-educated about  the importance of  commitment to a  minimum of 150 minutes of exercise per week.  The importance of healthy food choices with portion control discussed. Encouraged to start a food diary, count calories and to consider  joining a support group. Sample diet sheets offered. Goals set by the patient for the next several months.   Weight /BMI 11/14/2016 06/03/2016 03/20/2016  WEIGHT 197 lb 187 lb 184 lb 1.9 oz  HEIGHT 5\' 5"  5\' 5"  5\' 5"   BMI 32.78 kg/m2 31.12 kg/m2 30.64 kg/m2      Vitamin D deficiency Advised to take once daily vit D3 . Will check level at next lab draw  H/O: hypertension Normotensive off of medication DASH diet and commitment to daily physical activity for a minimum of 30 minutes discussed and encouraged, as a part of hypertension management. The importance of attaining a healthy weight is also discussed.  BP/Weight 11/14/2016 07/25/2016 06/03/2016 03/20/2016 03/08/2016 09/20/2015 [id number]  Systolic BP 123456 [id number] A999333 XX123456 95 A999333 [id number]  Diastolic BP 80 80 60 80 64 78 82  Wt. (Lbs) 197 - 187 184.12 180 181 180  BMI 32.78 - 31.12 30.64 29.95 30.12 29.74    Weight loss encouraged

## 2016-11-14 NOTE — Assessment & Plan Note (Signed)
Advised to take once daily vit D3 . Will check level at next lab draw

## 2016-11-14 NOTE — Assessment & Plan Note (Signed)
Deteriorated. Patient re-educated about  the importance of commitment to a  minimum of 150 minutes of exercise per week.  The importance of healthy food choices with portion control discussed. Encouraged to start a food diary, count calories and to consider  joining a support group. Sample diet sheets offered. Goals set by the patient for the next several months.   Weight /BMI 11/14/2016 06/03/2016 03/20/2016  WEIGHT 197 lb 187 lb 184 lb 1.9 oz  HEIGHT 5\' 5"  5\' 5"  5\' 5"   BMI 32.78 kg/m2 31.12 kg/m2 30.64 kg/m2

## 2016-11-14 NOTE — Patient Instructions (Addendum)
F/u in 6 months, call if you need me before   You are referred for well woman and pap   Please change to plant based diet   CBc, fasting lipid, chem 7,  and vit D for next appt  Take oTC vit D3 1000IU once daily  Thank you  for choosing [facility]. We consider it a privelige to serve you.  Delivering excellent health care in a caring and  compassionate way is our goal.  Partnering with you,  so that together we can achieve this goal is our strategy.   Please work on good  health habits so that your health will improve. 1. Commitment to daily physical activity for 30 to 60  minutes, if you are able to do this.  2. Commitment to wise food choices. Aim for half of your  food intake to be vegetable and fruit, one quarter starchy foods, and one quarter protein. Try to eat on a regular schedule  3 meals per day, snacking between meals should be limited to vegetables or fruits or small portions of nuts. 64 ounces of water per day is generally recommended, unless you have specific health conditions, like heart failure or kidney failure where you will need to limit fluid intake.  3. Commitment to sufficient and a  good quality of physical and mental rest daily, generally between 6 to 8 hours per day.  WITH PERSISTANCE AND PERSEVERANCE, THE IMPOSSIBLE , BECOMES THE NORM!

## 2016-12-11 ENCOUNTER — Other Ambulatory Visit: Payer: Self-pay | Admitting: Adult Health

## 2016-12-25 ENCOUNTER — Ambulatory Visit (INDEPENDENT_AMBULATORY_CARE_PROVIDER_SITE_OTHER): Payer: 59 | Admitting: Adult Health

## 2016-12-25 ENCOUNTER — Other Ambulatory Visit (HOSPITAL_COMMUNITY)
Admission: RE | Admit: 2016-12-25 | Discharge: 2016-12-25 | Disposition: A | Discharge status: Home / Self Care | Payer: 59 | Source: Ambulatory Visit | Attending: Adult Health | Admitting: Adult Health

## 2016-12-25 ENCOUNTER — Encounter: Payer: Self-pay | Admitting: Adult Health

## 2016-12-25 VITALS — BP 124/86 | HR 64 | Ht 67.75 in | Wt 199.6 lb

## 2016-12-25 DIAGNOSIS — Z1151 Encounter for screening for human papillomavirus (HPV): Secondary | ICD-10-CM | POA: Insufficient documentation

## 2016-12-25 DIAGNOSIS — Z01419 Encounter for gynecological examination (general) (routine) without abnormal findings: Secondary | ICD-10-CM | POA: Insufficient documentation

## 2016-12-25 DIAGNOSIS — Z1211 Encounter for screening for malignant neoplasm of colon: Secondary | ICD-10-CM

## 2016-12-25 DIAGNOSIS — Z1212 Encounter for screening for malignant neoplasm of rectum: Secondary | ICD-10-CM

## 2016-12-25 LAB — HEMOCCULT GUIAC POC 1CARD (OFFICE): Fecal Occult Blood, POC: NEGATIVE

## 2016-12-25 NOTE — Progress Notes (Signed)
Patient ID: Caitlyn Patterson, female   DOB: [birth year], 56 y.o.   MRN: [id number] History of Present Illness: Caitlyn Patterson is a 56 year old black female, married in for a well woman gyn exam and pap. PCP is Dr Moshe Cipro.    Current Medications, Allergies, Past Medical History, Past Surgical History, Family History and Social History were reviewed in [employer] record.     Review of Systems: Patient denies any headaches, hearing loss, fatigue, blurred vision, shortness of breath, chest pain, abdominal pain, problems with bowel movements, urination, or intercourse. No mood swings.Has pain in knees     Physical Exam:BP 124/86 (BP Location: Right Arm, Patient Position: Sitting, Cuff Size: Normal)   Pulse 64   Ht 5' 7.75" (1.721 m)   Wt 199 lb 9.6 oz (90.5 kg)   LMP 07/23/2013   BMI 30.57 kg/m  General:  Well developed, well nourished, no acute distress Skin:  Warm and dry Neck:  Midline trachea, normal thyroid, good ROM, no lymphadenopathy Lungs; Clear to auscultation bilaterally Breast:  No dominant palpable mass, retraction, or nipple discharge Cardiovascular: Regular rate and rhythm Abdomen:  Soft, non tender, no hepatosplenomegaly Pelvic:  External genitalia is normal in appearance, no lesions.  The vagina is normal in appearance. Urethra has no lesions or masses. The cervix is smooth, pap with HPV performed .  Uterus is felt to be normal size, shape, and contour.  No adnexal masses or tenderness noted.Bladder is non tender, no masses felt. Rectal: Good sphincter tone, no polyps, or hemorrhoids felt.  Hemoccult negative. Extremities/musculoskeletal:  No swelling or varicosities noted, no clubbing or cyanosis Psych:  No mood changes, alert and cooperative,seems happy PHQ 2 score 0.  Impression: 1. Encounter for gynecological examination with Papanicolaou smear of cervix   2.       Screening for colorectal cancer    Plan: Physical in 1 year Pap in 3 if  normal Mammogram yearly Labs with PCP Colonoscopy 2020

## 2016-12-25 NOTE — Patient Instructions (Signed)
Physical in 1 year Pap in 3 if normal Mammogram yearly Labs with PCP Colonoscopy 2020

## 2016-12-30 LAB — CYTOLOGY - PAP
Adequacy: ABSENT
Diagnosis: NEGATIVE
HPV: NOT DETECTED

## 2017-01-28 ENCOUNTER — Other Ambulatory Visit: Payer: Self-pay | Admitting: Family Medicine

## 2017-01-28 DIAGNOSIS — Z1231 Encounter for screening mammogram for malignant neoplasm of breast: Secondary | ICD-10-CM

## 2017-02-06 ENCOUNTER — Ambulatory Visit (HOSPITAL_COMMUNITY)
Admission: RE | Admit: 2017-02-06 | Discharge: 2017-02-06 | Disposition: A | Discharge status: Home / Self Care | Payer: 59 | Source: Ambulatory Visit | Attending: Family Medicine | Admitting: Family Medicine

## 2017-02-06 DIAGNOSIS — Z1231 Encounter for screening mammogram for malignant neoplasm of breast: Secondary | ICD-10-CM

## 2017-05-14 ENCOUNTER — Encounter: Payer: Self-pay | Admitting: Family Medicine

## 2017-05-14 ENCOUNTER — Ambulatory Visit (INDEPENDENT_AMBULATORY_CARE_PROVIDER_SITE_OTHER): Payer: 59 | Admitting: Family Medicine

## 2017-05-14 VITALS — BP 154/98 | HR 57 | Resp 16 | Ht 68.0 in | Wt 198.0 lb

## 2017-05-14 DIAGNOSIS — R194 Change in bowel habit: Secondary | ICD-10-CM

## 2017-05-14 DIAGNOSIS — E559 Vitamin D deficiency, unspecified: Secondary | ICD-10-CM | POA: Diagnosis not present

## 2017-05-14 DIAGNOSIS — E669 Obesity, unspecified: Secondary | ICD-10-CM | POA: Diagnosis not present

## 2017-05-14 DIAGNOSIS — Z1211 Encounter for screening for malignant neoplasm of colon: Secondary | ICD-10-CM | POA: Diagnosis not present

## 2017-05-14 DIAGNOSIS — I10 Essential (primary) hypertension: Secondary | ICD-10-CM | POA: Diagnosis not present

## 2017-05-14 DIAGNOSIS — Z8679 Personal history of other diseases of the circulatory system: Secondary | ICD-10-CM

## 2017-05-14 MED ORDER — AMLODIPINE BESYLATE 2.5 MG PO TABS: 2.5000 mg | ORAL_TABLET | Freq: Every day | ORAL | 3 refills | Status: DC

## 2017-05-14 NOTE — Assessment & Plan Note (Addendum)
2 month h/o relative constipartin, BM every 3 to 4 days  Instead of daily despite healthy dietary change and regular exercise  Rectal exam: no palpable mass, heme negative stool, refer for GI eval, reports colonoscopy approx 8 years prior

## 2017-05-14 NOTE — Patient Instructions (Addendum)
F/u in 6 weeks , 4 pm appt call if you need me before  Your blood pressure is again  High, please reduce salt intake, continue exercise for 30 mins 5 days per week or more  Start amlodipine one tablet once daily at the same time every day for blood pressure control   Fasting lipid, cbc, cmp and eGFR, TSH and vit D one week before next visit please  You are referred to Dr Fields re change in bowel movement   Stool has no hidden blood  Thank you  for choosing [facility]. We consider it a privelige to serve you.  Delivering excellent health care in a caring and  compassionate way is our goal.  Partnering with you,  so that together we can achieve this goal is our strategy.    

## 2017-05-15 ENCOUNTER — Encounter: Payer: Self-pay | Admitting: Family Medicine

## 2017-05-15 ENCOUNTER — Encounter: Payer: Self-pay | Admitting: Gastroenterology

## 2017-05-15 DIAGNOSIS — Z1211 Encounter for screening for malignant neoplasm of colon: Secondary | ICD-10-CM | POA: Insufficient documentation

## 2017-05-15 LAB — POC HEMOCCULT BLD/STL (OFFICE/1-CARD/DIAGNOSTIC): Fecal Occult Blood, POC: NEGATIVE

## 2017-05-15 NOTE — Progress Notes (Signed)
Caitlyn Patterson     MRN: [id number]      DOB: 1961-01-13   HPI Caitlyn Patterson is here for follow up and re-evaluation of chronic medical conditions,   Preventive health is updated, specifically  Cancer screening and Immunization.   Questions or concerns regarding consultations or procedures which the PT has had in the interim are  Addressed.Had gyne exam this year and mammogram is u up to date Continues to be consistent with healthy lifestyle involving food choice and exercise commitment. 2 month h/o constipation, BM every 3 to 4 days which are hard, this is new, despite healthy diet, no change in stool character noted, no blood in stool, had colonoscopy approx 8 yrs ago, no known f/h of colon cancer Was treated I the past for hypertension in the past with spironolactone and presented to the E hypotensive on one occasion, thereafter was d/c off meds for some time, reports getting blood pressure readings generally around 144 systolic over past several months ROS Denies recent fever or chills. Denies sinus pressure, nasal congestion, ear pain or sore throat. Denies chest congestion, productive cough or wheezing. Denies chest pains, palpitations and leg swelling Denies abdominal pain, nausea, vomiting,diarrhea Denies dysuria, frequency, hesitancy or incontinence. Denies joint pain, swelling and limitation in mobility. Denies headaches, seizures, numbness, or tingling. Denies depression, anxiety or insomnia. Denies skin break down or rash.   PE  BP (!) 154/98   Pulse (!) 57   Resp 16   Ht 5\' 8"  (1.727 m)   Wt 198 lb (89.8 kg)   LMP 07/23/2013   BMI 30.11 kg/m   Patient alert and oriented and in no cardiopulmonary distress.  HEENT: No facial asymmetry, EOMI,   oropharynx pink and moist.  Neck supple no JVD, no mass.  Chest: Clear to auscultation bilaterally.  CVS: S1, S2 no murmurs, no S3.Regular rate.  ABD: Soft non tender. No organomegaly or mass palpable, normal bS Rectal: no  mass, heme negativestool  Ext: No edema  MS: Adequate ROM spine, shoulders, hips and knees.  Skin: Intact, no ulcerations or rash noted.  Psych: Good eye contact, normal affect. Memory intact not anxious or depressed appearing.  CNS: CN 2-12 intact, power,  normal throughout.no focal deficits noted.   Assessment & Plan  Recent change in frequency of bowel movements 2 month h/o relative constipartin, BM every 3 to 4 days  Instead of daily despite healthy dietary change and regular exercise  Rectal exam: no palpable mass, heme negative stool, refer for GI eval, reports colonoscopy approx 8 years prior  Essential hypertension Repeatedly elevated BP in office with elevated readings at home with nmew guidelines. Start amlodipine 2.5 mg dail DASH diet and commitment to daily physical activity for a minimum of 30 minutes discussed and encouraged, as a part of hypertension management. The importance of attaining a healthy weight is also discussed.  BP/Weight 05/14/2017 12/25/2016 11/14/2016 07/25/2016 06/03/2016 03/20/2016 01/31/4007  Systolic BP 676 195 093 267 124 580 95  Diastolic BP 98 86 80 80 60 80 64  Wt. (Lbs) 198 199.6 197 - 187 184.12 180  BMI 30.11 30.57 32.78 - 31.12 30.64 29.95     F/u in 5 to  Weeks with fasting labs  Obesity (BMI 30.0-34.9) Unchanged Patient re-educated about  the importance of commitment to a  minimum of 150 minutes of exercise per week.  The importance of healthy food choices with portion control discussed. Encouraged to start a food diary, count calories and  to consider  joining a support group. Sample diet sheets offered. Goals set by the patient for the next several months.   Weight /BMI 05/14/2017 12/25/2016 11/14/2016  WEIGHT 198 lb 199 lb 9.6 oz 197 lb  HEIGHT 5\' 8"  5' 7.75" 5\' 5"   BMI 30.11 kg/m2 30.57 kg/m2 32.78 kg/m2      Colon cancer screening Heme negative stool

## 2017-05-15 NOTE — Addendum Note (Signed)
Addended by: Eual Fines on: 05/15/2017 07:58 AM   Modules accepted: Orders

## 2017-05-15 NOTE — Assessment & Plan Note (Signed)
Repeatedly elevated BP in office with elevated readings at home with nmew guidelines. Start amlodipine 2.5 mg dail DASH diet and commitment to daily physical activity for a minimum of 30 minutes discussed and encouraged, as a part of hypertension management. The importance of attaining a healthy weight is also discussed.  BP/Weight 05/14/2017 12/25/2016 11/14/2016 07/25/2016 06/03/2016 03/20/2016 12/16/2079  Systolic BP 388 719 597 471 855 015 95  Diastolic BP 98 86 80 80 60 80 64  Wt. (Lbs) 198 199.6 197 - 187 184.12 180  BMI 30.11 30.57 32.78 - 31.12 30.64 29.95     F/u in 5 to  Weeks with fasting labs

## 2017-05-15 NOTE — Assessment & Plan Note (Signed)
Unchanged Patient re-educated about  the importance of commitment to a  minimum of 150 minutes of exercise per week.  The importance of healthy food choices with portion control discussed. Encouraged to start a food diary, count calories and to consider  joining a support group. Sample diet sheets offered. Goals set by the patient for the next several months.   Weight /BMI 05/14/2017 12/25/2016 11/14/2016  WEIGHT 198 lb 199 lb 9.6 oz 197 lb  HEIGHT 5\' 8"  5' 7.75" 5\' 5"   BMI 30.11 kg/m2 30.57 kg/m2 32.78 kg/m2

## 2017-05-15 NOTE — Assessment & Plan Note (Signed)
Heme negative stool 

## 2017-06-14 LAB — CBC
HCT: 39.5 % (ref 35.0–45.0)
Hemoglobin: 13.3 g/dL (ref 11.7–15.5)
MCH: 32.8 pg (ref 27.0–33.0)
MCHC: 33.7 g/dL (ref 32.0–36.0)
MCV: 97.3 fL (ref 80.0–100.0)
MPV: 10.1 fL (ref 7.5–12.5)
PLATELETS: 185 10*3/uL (ref 140–400)
RBC: 4.06 MIL/uL (ref 3.80–5.10)
RDW: 12.4 % (ref 11.0–15.0)
WBC: 6.9 10*3/uL (ref 3.8–10.8)

## 2017-06-15 LAB — COMPLETE METABOLIC PANEL WITH GFR
ALT: 17 U/L (ref 6–29)
AST: 19 U/L (ref 10–35)
Albumin: 4.4 g/dL (ref 3.6–5.1)
Alkaline Phosphatase: 73 U/L (ref 33–130)
BILIRUBIN TOTAL: 0.5 mg/dL (ref 0.2–1.2)
BUN: 19 mg/dL (ref 7–25)
CHLORIDE: 101 mmol/L (ref 98–110)
CO2: 22 mmol/L (ref 20–31)
Calcium: 9.4 mg/dL (ref 8.6–10.4)
Creat: 0.87 mg/dL (ref 0.50–1.05)
GFR, EST AFRICAN AMERICAN: 87 mL/min (ref >=60)
GFR, EST NON AFRICAN AMERICAN: 76 mL/min (ref >=60)
GLUCOSE: 94 mg/dL (ref 65–99)
Potassium: 3.9 mmol/L (ref 3.5–5.3)
Sodium: 138 mmol/L (ref 135–146)
TOTAL PROTEIN: 7.4 g/dL (ref 6.1–8.1)

## 2017-06-15 LAB — LIPID PANEL
CHOL/HDL RATIO: 3 ratio (ref <5.0)
CHOLESTEROL: 200 mg/dL — AB (ref <200)
HDL: 67 mg/dL (ref >50)
LDL Cholesterol: 118 mg/dL — ABNORMAL HIGH (ref <100)
TRIGLYCERIDES: 73 mg/dL (ref <150)
VLDL: 15 mg/dL (ref <30)

## 2017-06-16 LAB — VITAMIN D 25 HYDROXY (VIT D DEFICIENCY, FRACTURES): Vit D, 25-Hydroxy: 16 ng/mL — ABNORMAL LOW (ref 30–100)

## 2017-06-25 ENCOUNTER — Ambulatory Visit (INDEPENDENT_AMBULATORY_CARE_PROVIDER_SITE_OTHER): Payer: 59 | Admitting: Family Medicine

## 2017-06-25 VITALS — BP 110/70 | HR 60 | Resp 16 | Ht 65.0 in | Wt 196.8 lb

## 2017-06-25 DIAGNOSIS — E669 Obesity, unspecified: Secondary | ICD-10-CM | POA: Diagnosis not present

## 2017-06-25 DIAGNOSIS — E559 Vitamin D deficiency, unspecified: Secondary | ICD-10-CM | POA: Diagnosis not present

## 2017-06-25 DIAGNOSIS — E785 Hyperlipidemia, unspecified: Secondary | ICD-10-CM | POA: Diagnosis not present

## 2017-06-25 DIAGNOSIS — I10 Essential (primary) hypertension: Secondary | ICD-10-CM | POA: Diagnosis not present

## 2017-06-29 ENCOUNTER — Encounter: Payer: Self-pay | Admitting: Family Medicine

## 2017-06-29 DIAGNOSIS — E785 Hyperlipidemia, unspecified: Secondary | ICD-10-CM | POA: Insufficient documentation

## 2017-06-29 MED ORDER — AMLODIPINE BESYLATE 2.5 MG PO TABS: 2.5000 mg | ORAL_TABLET | Freq: Every day | ORAL | 1 refills | Status: DC

## 2017-06-29 MED ORDER — ERGOCALCIFEROL 1.25 MG (50000 UT) PO CAPS: 50000.0000 [IU] | ORAL_CAPSULE | ORAL | 1 refills | Status: DC

## 2017-06-29 NOTE — Assessment & Plan Note (Signed)
Vit D level f 16 , recommend 6 months of once weekly vitamin D , and re evaluate at next visit

## 2017-06-29 NOTE — Patient Instructions (Addendum)
F/u in 6 months, call if you need me sooner  Blood pressure is excellent, stay on same dose of medication and take consistently at same time every day   Your cholesterol, total and bad cholesterol levels are above normal , so please reduce fried and fatty foods , to lower yourisk of heart disease and stroke and improve your health  You need to start taking once weekly vitamin D  Capsules for the next 6 months, these will be prescribed and are sent to your pharmacy, call us if when you check the pharmacy they are not there It is important that you exercise regularly at least 30 minutes 5 times a week. If you develop chest pain, have severe difficulty breathing, or feel very tired, stop exercising immediately and seek medical attention  Weight loss goal of 6 to 8 pounds  Labs fasting 1 week before your next visit, lipid, chem 7,TSH, and vit D Thank you  for choosing [facility]. We consider it a privelige to serve you.  Delivering excellent health care in a caring and  compassionate way is our goal.  Partnering with you,  so that together we can achieve this goal is our strategy.

## 2017-06-29 NOTE — Assessment & Plan Note (Signed)
.   Patient re-educated about  the importance of commitment to a  minimum of 150 minutes of exercise per week.  The importance of healthy food choices with portion control discussed. Encouraged to start a food diary, count calories and to consider  joining a support group. Sample diet sheets offered. Goals set by the patient for the next several months.Goal of 6 to 8 pounds in 6 monhts   Weight /BMI 06/25/2017 05/14/2017 12/25/2016  WEIGHT 196 lb 12 oz 198 lb 199 lb 9.6 oz  HEIGHT 5\' 5"  5\' 8"  5' 7.75"  BMI 32.74 kg/m2 30.11 kg/m2 30.57 kg/m2

## 2017-06-29 NOTE — Progress Notes (Signed)
   Caitlyn Patterson     MRN: [id number]      DOB: 11-27-1960   HPI Caitlyn Patterson is here for follow up and re-evaluation of chronic medical conditions, medication management and review of any available recent lab and radiology data.  Preventive health is updated, specifically  Cancer screening and Immunization.    The PT denies any adverse reactions to current medications since the last visit.  There are no new concerns.  There are no specific complaints   ROS Denies recent fever or chills. Denies sinus pressure, nasal congestion, ear pain or sore throat. Denies chest congestion, productive cough or wheezing. Denies chest pains, palpitations and leg swelling Denies abdominal pain, nausea, vomiting,diarrhea or constipation.   Denies dysuria, frequency, hesitancy or incontinence. Denies joint pain, swelling and limitation in mobility. Denies headaches, seizures, numbness, or tingling. Denies depression, anxiety or insomnia. Denies skin break down or rash.   PE BP 110/70   Pulse 60   Resp 16   Ht 5\' 5"  (1.651 m)   Wt 196 lb 12 oz (89.2 kg)   LMP 07/23/2013   BMI 32.74 kg/m    LMP 07/23/2013   Patient alert and oriented and in no cardiopulmonary distress.  HEENT: No facial asymmetry, EOMI,   oropharynx pink and moist.  Neck supple no JVD, no mass.  Chest: Clear to auscultation bilaterally.  CVS: S1, S2 no murmurs, no S3.Regular rate.  ABD: Soft non tender.   Ext: No edema  MS: Adequate ROM spine, shoulders, hips and knees.  Skin: Intact, no ulcerations or rash noted.  Psych: Good eye contact, normal affect. Memory intact not anxious or depressed appearing.  CNS: CN 2-12 intact, power,  normal throughout.no focal deficits noted.   Assessment & Plan  Essential hypertension Controlled, no change in medication DASH diet and commitment to daily physical activity for a minimum of 30 minutes discussed and encouraged, as a part of hypertension management. The  importance of attaining a healthy weight is also discussed.  BP/Weight 06/25/2017 05/14/2017 12/25/2016 11/14/2016 07/25/2016 05/16/4764 02/21/5034  Systolic BP 465 681 275 170 017 494 496  Diastolic BP 70 98 86 80 80 60 80  Wt. (Lbs) 196.75 198 199.6 197 - 187 184.12  BMI 32.74 30.11 30.57 32.78 - 31.12 30.64       Obesity (BMI 30.0-34.9) . Patient re-educated about  the importance of commitment to a  minimum of 150 minutes of exercise per week.  The importance of healthy food choices with portion control discussed. Encouraged to start a food diary, count calories and to consider  joining a support group. Sample diet sheets offered. Goals set by the patient for the next several months.Goal of 6 to 8 pounds in 6 monhts   Weight /BMI 06/25/2017 05/14/2017 12/25/2016  WEIGHT 196 lb 12 oz 198 lb 199 lb 9.6 oz  HEIGHT 5\' 5"  5\' 8"  5' 7.75"  BMI 32.74 kg/m2 30.11 kg/m2 30.57 kg/m2      Vitamin D deficiency Vit D level f 16 , recommend 6 months of once weekly vitamin D , and re evaluate at next visit  Hyperlipidemia LDL goal <100 Hyperlipidemia:Low fat diet discussed and encouraged.   Lipid Panel  Lab Results  Component Value Date   CHOL 200 (H) 06/14/2017   HDL 67 06/14/2017   LDLCALC 118 (H) 06/14/2017   TRIG 73 06/14/2017   CHOLHDL 3.0 06/14/2017   Updated lab needed at/ before next visit.

## 2017-06-29 NOTE — Assessment & Plan Note (Signed)
Hyperlipidemia:Low fat diet discussed and encouraged.   Lipid Panel  Lab Results  Component Value Date   CHOL 200 (H) 06/14/2017   HDL 67 06/14/2017   LDLCALC 118 (H) 06/14/2017   TRIG 73 06/14/2017   CHOLHDL 3.0 06/14/2017   Updated lab needed at/ before next visit.

## 2017-06-29 NOTE — Assessment & Plan Note (Signed)
Controlled, no change in medication DASH diet and commitment to daily physical activity for a minimum of 30 minutes discussed and encouraged, as a part of hypertension management. The importance of attaining a healthy weight is also discussed.  BP/Weight 06/25/2017 05/14/2017 12/25/2016 11/14/2016 07/25/2016 4/54/0981 11/26/1476  Systolic BP 295 621 308 657 846 962 952  Diastolic BP 70 98 86 80 80 60 80  Wt. (Lbs) 196.75 198 199.6 197 - 187 184.12  BMI 32.74 30.11 30.57 32.78 - 31.12 30.64

## 2017-06-30 NOTE — Addendum Note (Signed)
Addended by: Eual Fines on: 06/30/2017 08:35 AM   Modules accepted: Orders

## 2017-07-10 ENCOUNTER — Encounter: Payer: Self-pay | Admitting: Gastroenterology

## 2017-07-10 ENCOUNTER — Ambulatory Visit (INDEPENDENT_AMBULATORY_CARE_PROVIDER_SITE_OTHER): Payer: 59 | Admitting: Gastroenterology

## 2017-07-10 DIAGNOSIS — K5900 Constipation, unspecified: Secondary | ICD-10-CM | POA: Diagnosis not present

## 2017-07-10 NOTE — Assessment & Plan Note (Signed)
56 year old female with worsening constipation for past 3 months despite dietary changes and OTC agents. No alarm symptoms. Heme negative recently, and no anemia on recent labs. Will trial Linzess 290 mcg first. 145 mcg samples provided as well. If no improvement despite prescriptive agents, pursue colonoscopy. Last colonoscopy in 2010 without polyps. Return in 6-8 weeks.

## 2017-07-10 NOTE — Patient Instructions (Addendum)
For constipation: start with the Linzess 290 microgram samples, once each morning, 30 minutes before breakfast. You may have looser stool to start with, but this should get better. If not, decrease to the 145 microgram dose once each morning.   We will see you in 6-8 weeks!   High-Fiber Diet Fiber, also called dietary fiber, is a type of carbohydrate found in fruits, vegetables, whole grains, and beans. A high-fiber diet can have many health benefits. Your health care provider may recommend a high-fiber diet to help:  Prevent constipation. Fiber can make your bowel movements more regular.  Lower your cholesterol.  Relieve hemorrhoids, uncomplicated diverticulosis, or irritable bowel syndrome.  Prevent overeating as part of a weight-loss plan.  Prevent heart disease, type 2 diabetes, and certain cancers.  What is my plan? The recommended daily intake of fiber includes:  38 grams for men under age 15.  5 grams for men over age 30.  64 grams for women under age 34.  51 grams for women over age 24.  You can get the recommended daily intake of dietary fiber by eating a variety of fruits, vegetables, grains, and beans. Your health care provider may also recommend a fiber supplement if it is not possible to get enough fiber through your diet. What do I need to know about a high-fiber diet?  Fiber supplements have not been widely studied for their effectiveness, so it is better to get fiber through food sources.  Always check the fiber content on thenutrition facts label of any prepackaged food. Look for foods that contain at least 5 grams of fiber per serving.  Ask your dietitian if you have questions about specific foods that are related to your condition, especially if those foods are not listed in the following section.  Increase your daily fiber consumption gradually. Increasing your intake of dietary fiber too quickly may cause bloating, cramping, or gas.  Drink plenty of  water. Water helps you to digest fiber. What foods can I eat? Grains Whole-grain breads. Multigrain cereal. Oats and oatmeal. Brown rice. Barley. Bulgur wheat. [place]. Bran muffins. Popcorn. Rye wafer crackers. Vegetables Sweet potatoes. Spinach. Kale. Artichokes. Cabbage. Broccoli. Green peas. Carrots. Squash. Fruits Berries. Pears. Apples. Oranges. Avocados. Prunes and raisins. Dried figs. Meats and Other Protein Sources [place], kidney, pinto, and soy beans. Split peas. Lentils. Nuts and seeds. Dairy Fiber-fortified yogurt. Beverages Fiber-fortified soy milk. Fiber-fortified orange juice. Other Fiber bars. The items listed above may not be a complete list of recommended foods or beverages. Contact your dietitian for more options. What foods are not recommended? Grains White bread. Pasta made with refined flour. White rice. Vegetables Fried potatoes. Canned vegetables. Well-cooked vegetables. Fruits Fruit juice. Cooked, strained fruit. Meats and Other Protein Sources Fatty cuts of meat. Fried Sales executive or fried fish. Dairy Milk. Yogurt. Cream cheese. Sour cream. Beverages Soft drinks. Other Cakes and pastries. Butter and oils. The items listed above may not be a complete list of foods and beverages to avoid. Contact your dietitian for more information. What are some tips for including high-fiber foods in my diet?  Eat a wide variety of high-fiber foods.  Make sure that half of all grains consumed each day are whole grains.  Replace breads and cereals made from refined flour or white flour with whole-grain breads and cereals.  Replace white rice with brown rice, bulgur wheat, or millet.  Start the day with a breakfast that is high in fiber, such as a cereal that contains at least  5 grams of fiber per serving.  Use beans in place of meat in soups, salads, or pasta.  Eat high-fiber snacks, such as berries, raw vegetables, nuts, or popcorn. This information is not intended to  replace advice given to you by your health care provider. Make sure you discuss any questions you have with your health care provider. Document Released: 11/04/2005 Document Revised: 04/11/2016 Document Reviewed: 04/19/2014 Elsevier Interactive Patient Education  2017 [employer].

## 2017-07-10 NOTE — Progress Notes (Signed)
Primary Care Physician:  Fayrene Helper, MD Primary Gastroenterologist:  Dr. Oneida Alar   Chief Complaint  Patient presents with  . Constipation    HPI:   Caitlyn Patterson is a very pleasant 56 y.o. female presenting today at the request of Dr. Moshe Cipro secondary to change in bowel habits: constipation. She was last seen in 2010 due to constipation and prescribed Amitiza 24 mcg BID. Colonoscopy in 2010 normal.   She presents with a 3 month history of worsening constipation. BM twice per week. Correctol sometimes helps, sometimes not. Oatmeal and prunes at times. No rectal bleeding. RLQ discomfort improved with defecation. Good appetite. Tries to eat healthy at work. Works night shift. No weight loss. No first degree relatives with colon cancer. No FH of colon polyps. Heme negative recently.   Past Medical History:  Diagnosis Date  . BV (bacterial vaginosis)   . Fibroid   . Hematuria 09/20/2015  . Hypertension   . Menopause 07/15/2014  . Pain with urination 09/20/2015  . Rectocele 04/20/2013  . Vaginal dryness 07/15/2014  . Yeast vaginitis 08/06/2013    Past Surgical History:  Procedure Laterality Date  . COLONOSCOPY  2010   Dr. Oneida Alar: normal, small internal hemorrhoids  . NO PAST SURGERIES    . varicose veins      Current Outpatient Prescriptions  Medication Sig Dispense Refill  . amLODipine (NORVASC) 2.5 MG tablet Take 1 tablet (2.5 mg total) by mouth daily. 90 tablet 1  . ergocalciferol (VITAMIN D2) 50000 units capsule Take 1 capsule (50,000 Units total) by mouth once a week. One capsule once weekly (Patient not taking: Reported on 07/10/2017) 12 capsule 1   No current facility-administered medications for this visit.     Allergies as of 07/10/2017 - Review Complete 07/10/2017  Allergen Reaction Noted  . Amoxicillin  04/18/2009  . Ibuprofen  03/13/2009    Family History  Problem Relation Age of Onset  . Cancer Maternal Grandmother        colon   . Stroke Mother     . Hypertension Mother   . Cancer Maternal Aunt        breast  . Pneumonia Father   . Hypertension Brother   . Hypertension Sister   . Varicose Veins Sister   . Diabetes Paternal Aunt   . Colon polyps Neg Hx     Social History   Social History  . Marital status: Married    Spouse name: N/A  . Number of children: N/A  . Years of education: N/A   Occupational History  . Not on file.   Social History Main Topics  . Smoking status: Never Smoker  . Smokeless tobacco: Never Used  . Alcohol use 1.2 - 1.8 oz/week    1 - 2 Glasses of wine, 1 Shots of liquor per week     Comment: occ  . Drug use: No  . Sexual activity: Yes    Birth control/ protection: Post-menopausal   Other Topics Concern  . Not on file   Social History Narrative  . No narrative on file    Review of Systems: Gen: Denies any fever, chills, fatigue, weight loss, lack of appetite.  CV: Denies chest pain, heart palpitations, peripheral edema, syncope.  Resp: Denies shortness of breath at rest or with exertion. Denies wheezing or cough.  GI: see HPI  GU : Denies urinary burning, urinary frequency, urinary hesitancy MS: Denies joint pain, muscle weakness, cramps, or limitation of movement.  Derm: Denies rash, itching, dry skin Psych: Denies depression, anxiety, memory loss, and confusion Heme: Denies bruising, bleeding, and enlarged lymph nodes.  Physical Exam: BP 110/77   Pulse 75   Temp (!) 97.1 F (36.2 C) (Oral)   Ht 5\' 5"  (1.651 m)   Wt 195 lb 12.8 oz (88.8 kg)   LMP 07/23/2013   BMI 32.58 kg/m  General:   Alert and oriented. Pleasant and cooperative. Well-nourished and well-developed.  Head:  Normocephalic and atraumatic. Eyes:  Without icterus, sclera clear and conjunctiva pink.  Ears:  Normal auditory acuity. Nose:  No deformity, discharge,  or lesions. Mouth:  No deformity or lesions, oral mucosa pink.  Lungs:  Clear to auscultation bilaterally. No wheezes, rales, or rhonchi. No distress.   Heart:  S1, S2 present without murmurs appreciated.  Abdomen:  +BS, soft, non-tender and non-distended. No HSM noted. No guarding or rebound. No masses appreciated.  Rectal:  Deferred  Msk:  Symmetrical without gross deformities. Normal posture. Extremities:  Without  edema. Neurologic:  Alert and  oriented x4 Psych:  Alert and cooperative. Normal mood and affect.  Lab Results  Component Value Date   ALT 17 06/14/2017   AST 19 06/14/2017   ALKPHOS 73 06/14/2017   BILITOT 0.5 06/14/2017   Lab Results  Component Value Date   WBC 6.9 06/14/2017   HGB 13.3 06/14/2017   HCT 39.5 06/14/2017   MCV 97.3 06/14/2017   PLT 185 06/14/2017   Lab Results  Component Value Date   CREATININE 0.87 06/14/2017   BUN 19 06/14/2017   NA 138 06/14/2017   K 3.9 06/14/2017   CL 101 06/14/2017   CO2 22 06/14/2017

## 2017-07-10 NOTE — Progress Notes (Signed)
cc'ed to pcp °

## 2017-08-07 DIAGNOSIS — I83893 Varicose veins of bilateral lower extremities with other complications: Secondary | ICD-10-CM | POA: Diagnosis not present

## 2017-09-04 ENCOUNTER — Ambulatory Visit (INDEPENDENT_AMBULATORY_CARE_PROVIDER_SITE_OTHER): Payer: 59

## 2017-09-04 DIAGNOSIS — Z23 Encounter for immunization: Secondary | ICD-10-CM

## 2017-09-04 DIAGNOSIS — I8311 Varicose veins of right lower extremity with inflammation: Secondary | ICD-10-CM | POA: Diagnosis not present

## 2017-09-04 DIAGNOSIS — I8312 Varicose veins of left lower extremity with inflammation: Secondary | ICD-10-CM | POA: Diagnosis not present

## 2017-09-08 ENCOUNTER — Ambulatory Visit: Payer: 59 | Admitting: Gastroenterology

## 2017-09-09 ENCOUNTER — Ambulatory Visit: Payer: 59 | Admitting: Gastroenterology

## 2017-09-09 DIAGNOSIS — I83813 Varicose veins of bilateral lower extremities with pain: Secondary | ICD-10-CM | POA: Diagnosis not present

## 2017-10-07 ENCOUNTER — Other Ambulatory Visit: Payer: Self-pay

## 2017-10-07 ENCOUNTER — Telehealth: Payer: Self-pay | Admitting: Family Medicine

## 2017-10-07 MED ORDER — AMLODIPINE BESYLATE 2.5 MG PO TABS: 2.5000 mg | ORAL_TABLET | Freq: Every day | ORAL | 1 refills | Status: DC

## 2017-10-07 MED ORDER — ERGOCALCIFEROL 1.25 MG (50000 UT) PO CAPS: 50000.0000 [IU] | ORAL_CAPSULE | ORAL | 1 refills | Status: DC

## 2017-10-07 NOTE — Telephone Encounter (Signed)
Patient left a message requesting all medications to be transferred to [facility] Cb#: [phone]

## 2017-10-07 NOTE — Telephone Encounter (Signed)
meds sent in

## 2017-10-29 ENCOUNTER — Ambulatory Visit: Payer: 59 | Admitting: Family Medicine

## 2017-10-29 ENCOUNTER — Encounter: Payer: Self-pay | Admitting: Family Medicine

## 2017-10-29 ENCOUNTER — Other Ambulatory Visit: Payer: Self-pay

## 2017-10-29 VITALS — BP 118/80 | HR 56 | Resp 16 | Ht 65.0 in | Wt 193.0 lb

## 2017-10-29 DIAGNOSIS — E669 Obesity, unspecified: Secondary | ICD-10-CM | POA: Diagnosis not present

## 2017-10-29 DIAGNOSIS — R0789 Other chest pain: Secondary | ICD-10-CM

## 2017-10-29 DIAGNOSIS — I10 Essential (primary) hypertension: Secondary | ICD-10-CM | POA: Diagnosis not present

## 2017-10-29 DIAGNOSIS — N63 Unspecified lump in unspecified breast: Secondary | ICD-10-CM | POA: Diagnosis not present

## 2017-10-29 DIAGNOSIS — N644 Mastodynia: Secondary | ICD-10-CM | POA: Diagnosis not present

## 2017-10-29 NOTE — Patient Instructions (Addendum)
F/u as before, call if you need me sooner  Please get appointment for mammogram at checkout  eKG today  You are being referred for diagnostic mammogram due 1 month h/o left breast pain and the fact that I feel a nodule in the 6  oclock posiiton

## 2017-10-30 DIAGNOSIS — I83812 Varicose veins of left lower extremities with pain: Secondary | ICD-10-CM | POA: Diagnosis not present

## 2017-11-01 ENCOUNTER — Encounter: Payer: Self-pay | Admitting: Family Medicine

## 2017-11-01 DIAGNOSIS — N644 Mastodynia: Secondary | ICD-10-CM | POA: Insufficient documentation

## 2017-11-01 DIAGNOSIS — R0789 Other chest pain: Secondary | ICD-10-CM | POA: Insufficient documentation

## 2017-11-01 NOTE — Assessment & Plan Note (Signed)
Improved. Pt applauded on succesful weight loss through lifestyle change, and encouraged to continue same. Weight loss goal set for the next several months.  

## 2017-11-01 NOTE — Assessment & Plan Note (Signed)
One month history of intermittent left breast pain with palpable tender  Nodule at 6 o clock , needs diagnostic mammogram

## 2017-11-01 NOTE — Assessment & Plan Note (Signed)
Controlled, no change in medication DASH diet and commitment to daily physical activity for a minimum of 30 minutes discussed and encouraged, as a part of hypertension management. The importance of attaining a healthy weight is also discussed.  BP/Weight 10/29/2017 07/10/2017 06/25/2017 05/14/2017 12/25/2016 43/15/4008 04/24/6194  Systolic BP 093 267 124 580 998 338 250  Diastolic BP 80 77 70 98 86 80 80  Wt. (Lbs) 193 195.8 196.75 198 199.6 197 -  BMI 32.12 32.58 32.74 30.11 30.57 32.78 -

## 2017-11-01 NOTE — Progress Notes (Signed)
Caitlyn Patterson     MRN: [id number]      DOB: [birth year]   HPI Caitlyn Patterson is here with a 1 month h/o pain in left breast intermittently , she denies any trauma to the breast, she denies nipple discharge or any inversion, she states her maternal grandmother had breast cancer , but is uncertain of the age of presentation. She also reports new lower abdominal cramping pain as though she will have a menstrual cycle, she denies any spotting, she is menopausal She c/o intermittent left precordial / chest pain during the past month , with/ without activity, has recently had to  Resume antihypertensive medication, pain is non radiating, and has no associated nausea or diaphoresis or lightheadedness. Pain is not related to eating , nor is it aggravated by bending over or lyiung down, no relieving factor  noted ROS Denies recent fever or chills. Denies sinus pressure, nasal congestion, ear pain or sore throat. Denies chest congestion, productive cough or wheezing. Denies PND, orthopnea, palpitations and leg swelling Denies abdominal pain, nausea, vomiting,diarrhea or constipation.   Denies dysuria, frequency, hesitancy or incontinence. Denies joint pain, swelling and limitation in mobility. Denies headaches, seizures, numbness, or tingling. Denies depression, anxiety or insomnia. Denies skin break down or rash.   PE  BP 118/80   Pulse (!) 56   Resp 16   Ht 5\' 5"  (1.651 m)   Wt 193 lb (87.5 kg)   LMP 07/23/2013   SpO2 99%   BMI 32.12 kg/m   Patient alert and oriented and in no cardiopulmonary distress.  HEENT: No facial asymmetry, EOMI,   oropharynx pink and moist.  Neck supple no JVD, no mass. Breast: Right Breast; no mass, no tenderness, no nipple discharge, or inversion, no axillary or supraclavicular adenopathy Left breast: Tender nodule palpable at 6 o clock position in areolar area, no nipple discharge or inversion, no axillary or supraclavicular nodes No asymetry of  breasts  Chest: Clear to auscultation bilaterally.No reproducible chest wall tenderness  CVS: S1, S2 no murmurs, no S3.Regular rate. EKG: sinus bradycardia, rate 56, no ischemia, no LVH, normal axis, unchanged from 2017  ABD: Soft non tender.   Ext: No edema  MS: Adequate ROM spine, shoulders, hips and knees.  Skin: Intact, no ulcerations or rash noted.  Psych: Good eye contact, normal affect. Memory intact not anxious or depressed appearing.  CNS: CN 2-12 intact, power,  normal throughout.no focal deficits noted.   Assessment & Plan Breast pain, left One month history of intermittent left breast pain with palpable tender  Nodule at 6 o clock , needs diagnostic mammogram  Chest pain, atypical One month h/o left chest pain , pt has hypertension and hyperlipidemia, EKG in office unchanged form 2017 with no ischemic changes, sinus rhythm, , no LVH  Essential hypertension Controlled, no change in medication DASH diet and commitment to daily physical activity for a minimum of 30 minutes discussed and encouraged, as a part of hypertension management. The importance of attaining a healthy weight is also discussed.  BP/Weight 10/29/2017 07/10/2017 06/25/2017 05/14/2017 12/25/2016 78/29/5621 3/0/8657  Systolic BP 846 962 952 841 324 401 027  Diastolic BP 80 77 70 98 86 80 80  Wt. (Lbs) 193 195.8 196.75 198 199.6 197 -  BMI 32.12 32.58 32.74 30.11 30.57 32.78 -       Obesity (BMI 30.0-34.9) Improved. Pt applauded on succesful weight loss through lifestyle change, and encouraged to continue same. Weight loss goal set for the  next several months.

## 2017-11-01 NOTE — Assessment & Plan Note (Signed)
One month h/o left chest pain , pt has hypertension and hyperlipidemia, EKG in office unchanged form 2017 with no ischemic changes, sinus rhythm, , no LVH

## 2017-11-13 ENCOUNTER — Ambulatory Visit (HOSPITAL_COMMUNITY)
Admission: RE | Admit: 2017-11-13 | Discharge: 2017-11-13 | Disposition: A | Discharge status: Home / Self Care | Payer: 59 | Source: Ambulatory Visit | Attending: Family Medicine | Admitting: Family Medicine

## 2017-11-13 DIAGNOSIS — N644 Mastodynia: Secondary | ICD-10-CM

## 2017-11-13 DIAGNOSIS — N63 Unspecified lump in unspecified breast: Secondary | ICD-10-CM

## 2017-11-13 DIAGNOSIS — R922 Inconclusive mammogram: Secondary | ICD-10-CM | POA: Diagnosis not present

## 2017-11-27 DIAGNOSIS — I83812 Varicose veins of left lower extremities with pain: Secondary | ICD-10-CM | POA: Diagnosis not present

## 2017-11-27 DIAGNOSIS — I8312 Varicose veins of left lower extremity with inflammation: Secondary | ICD-10-CM | POA: Diagnosis not present

## 2017-12-23 ENCOUNTER — Ambulatory Visit: Payer: 59 | Admitting: Family Medicine

## 2018-01-08 DIAGNOSIS — I83812 Varicose veins of left lower extremities with pain: Secondary | ICD-10-CM | POA: Diagnosis not present

## 2018-01-08 DIAGNOSIS — I8312 Varicose veins of left lower extremity with inflammation: Secondary | ICD-10-CM | POA: Diagnosis not present

## 2018-01-22 DIAGNOSIS — I83811 Varicose veins of right lower extremities with pain: Secondary | ICD-10-CM | POA: Diagnosis not present

## 2018-02-05 DIAGNOSIS — I8311 Varicose veins of right lower extremity with inflammation: Secondary | ICD-10-CM | POA: Diagnosis not present

## 2018-02-05 DIAGNOSIS — I83811 Varicose veins of right lower extremities with pain: Secondary | ICD-10-CM | POA: Diagnosis not present

## 2018-02-18 DIAGNOSIS — I8311 Varicose veins of right lower extremity with inflammation: Secondary | ICD-10-CM | POA: Diagnosis not present

## 2018-02-18 DIAGNOSIS — I83811 Varicose veins of right lower extremities with pain: Secondary | ICD-10-CM | POA: Diagnosis not present

## 2018-02-25 ENCOUNTER — Other Ambulatory Visit: Payer: Self-pay | Admitting: Family Medicine

## 2018-03-05 DIAGNOSIS — I8311 Varicose veins of right lower extremity with inflammation: Secondary | ICD-10-CM | POA: Diagnosis not present

## 2018-03-11 ENCOUNTER — Other Ambulatory Visit: Payer: Self-pay | Admitting: Family Medicine

## 2018-03-11 DIAGNOSIS — Z1231 Encounter for screening mammogram for malignant neoplasm of breast: Secondary | ICD-10-CM

## 2018-03-19 ENCOUNTER — Ambulatory Visit (HOSPITAL_COMMUNITY)
Admission: RE | Admit: 2018-03-19 | Discharge: 2018-03-19 | Disposition: A | Discharge status: Home / Self Care | Payer: 59 | Source: Ambulatory Visit | Attending: Family Medicine | Admitting: Family Medicine

## 2018-03-19 ENCOUNTER — Ambulatory Visit (HOSPITAL_COMMUNITY): Payer: 59

## 2018-03-19 ENCOUNTER — Encounter (HOSPITAL_COMMUNITY): Payer: Self-pay

## 2018-03-19 DIAGNOSIS — Z1231 Encounter for screening mammogram for malignant neoplasm of breast: Secondary | ICD-10-CM | POA: Diagnosis not present

## 2018-04-15 DIAGNOSIS — I8312 Varicose veins of left lower extremity with inflammation: Secondary | ICD-10-CM | POA: Diagnosis not present

## 2018-04-29 DIAGNOSIS — I8311 Varicose veins of right lower extremity with inflammation: Secondary | ICD-10-CM | POA: Diagnosis not present

## 2018-05-13 DIAGNOSIS — I8312 Varicose veins of left lower extremity with inflammation: Secondary | ICD-10-CM | POA: Diagnosis not present

## 2018-07-14 ENCOUNTER — Ambulatory Visit: Payer: 59 | Admitting: Family Medicine

## 2018-07-16 ENCOUNTER — Other Ambulatory Visit: Payer: Self-pay | Admitting: Family Medicine

## 2018-08-03 ENCOUNTER — Other Ambulatory Visit: Payer: Self-pay

## 2018-08-03 ENCOUNTER — Ambulatory Visit (INDEPENDENT_AMBULATORY_CARE_PROVIDER_SITE_OTHER): Payer: 59 | Admitting: Women's Health

## 2018-08-03 ENCOUNTER — Encounter: Payer: Self-pay | Admitting: Women's Health

## 2018-08-03 ENCOUNTER — Ambulatory Visit: Payer: 59 | Admitting: Adult Health

## 2018-08-03 VITALS — BP 120/70 | HR 74 | Ht 65.0 in | Wt 197.0 lb

## 2018-08-03 DIAGNOSIS — R3915 Urgency of urination: Secondary | ICD-10-CM

## 2018-08-03 DIAGNOSIS — N3000 Acute cystitis without hematuria: Secondary | ICD-10-CM | POA: Diagnosis not present

## 2018-08-03 LAB — POCT URINALYSIS DIPSTICK OB
Blood, UA: NEGATIVE
Glucose, UA: NEGATIVE
KETONES UA: NEGATIVE
Nitrite, UA: NEGATIVE
POC,PROTEIN,UA: NEGATIVE

## 2018-08-03 MED ORDER — SULFAMETHOXAZOLE-TRIMETHOPRIM 800-160 MG PO TABS: 1.0000 | ORAL_TABLET | Freq: Two times a day (BID) | ORAL | 0 refills | Status: DC

## 2018-08-03 NOTE — Progress Notes (Signed)
   GYN VISIT Patient name: Caitlyn Patterson MRN [id number]  Date of birth: 1961-11-02 Chief Complaint:   Urinary Urgency  History of Present Illness:   KINNLEY PAULSON is a 57 y.o. [id number] African American female being seen today for report of urinary urgency and only small amounts of urine x 3 weeks. Denies frequency, dysuria. Has had UTI before and this feels like one to her.      Patient's last menstrual period was 07/23/2013. The current method of family planning is post menopausal status. Last pap 12/25/16. Results were:  neg w/ -HRHPV Review of Systems:   Pertinent items are noted in HPI Denies fever/chills, dizziness, headaches, visual disturbances, fatigue, shortness of breath, chest pain, abdominal pain, vomiting, abnormal vaginal discharge/itching/odor/irritation, problems with periods, bowel movements, urination, or intercourse unless otherwise stated above.  Pertinent History Reviewed:  Reviewed past medical,surgical, social, obstetrical and family history.  Reviewed problem list, medications and allergies. Physical Assessment:   Vitals:   08/03/18 1201  BP: 120/70  Pulse: 74  Weight: 197 lb (89.4 kg)  Height: 5\' 5"  (1.651 m)  Body mass index is 32.78 kg/m.       Physical Examination:   General appearance: alert, well appearing, and in no distress  Mental status: alert, oriented to person, place, and time  Skin: warm & dry   Cardiovascular: normal heart rate noted  Respiratory: normal respiratory effort, no distress  Abdomen: soft, non-tender   Pelvic: examination not indicated  Extremities: no edema   Results for orders placed or performed in visit on 08/03/18 (from the past 24 hour(s))  POC Urinalysis Dipstick OB   Collection Time: 08/03/18 12:02 PM  Result Value Ref Range   Color, UA     Clarity, UA     Glucose, UA Negative Negative   Bilirubin, UA     Ketones, UA neg    Spec Grav, UA     Blood, UA neg    pH, UA     POC Protein UA Negative Negative,  Trace   Urobilinogen, UA     Nitrite, UA neg    Leukocytes, UA Moderate (2+) (A) Negative   Appearance     Odor      Assessment & Plan:  1) Presumed UTI> rx septra ds bid x 14d, send urine cx  Meds:  Meds ordered this encounter  Medications  . sulfamethoxazole-trimethoprim (BACTRIM DS,SEPTRA DS) 800-160 MG tablet    Sig: Take 1 tablet by mouth 2 (two) times daily. X 7 days    Dispense:  14 tablet    Refill:  0    Order Specific Question:   Supervising Provider    Answer:   Tania Ade H [[id number]]    Orders Placed This Encounter  Procedures  . Urine Culture  . POC Urinalysis Dipstick OB    Return for within 1 month for physical.  Roma Schanz CNM, [facility] 08/03/2018 12:13 PM

## 2018-08-05 ENCOUNTER — Telehealth: Payer: Self-pay | Admitting: *Deleted

## 2018-08-06 ENCOUNTER — Encounter (HOSPITAL_COMMUNITY): Payer: Self-pay | Admitting: Emergency Medicine

## 2018-08-06 ENCOUNTER — Emergency Department (HOSPITAL_COMMUNITY): Payer: 59

## 2018-08-06 ENCOUNTER — Other Ambulatory Visit: Payer: Self-pay

## 2018-08-06 ENCOUNTER — Emergency Department (HOSPITAL_COMMUNITY)
Admission: EM | Admit: 2018-08-06 | Discharge: 2018-08-06 | Disposition: A | Discharge status: Home / Self Care | Payer: 59 | Attending: Emergency Medicine | Admitting: Emergency Medicine

## 2018-08-06 ENCOUNTER — Ambulatory Visit: Payer: 59 | Admitting: Advanced Practice Midwife

## 2018-08-06 DIAGNOSIS — E785 Hyperlipidemia, unspecified: Secondary | ICD-10-CM | POA: Insufficient documentation

## 2018-08-06 DIAGNOSIS — R1011 Right upper quadrant pain: Secondary | ICD-10-CM | POA: Insufficient documentation

## 2018-08-06 DIAGNOSIS — Z79899 Other long term (current) drug therapy: Secondary | ICD-10-CM | POA: Diagnosis not present

## 2018-08-06 DIAGNOSIS — I10 Essential (primary) hypertension: Secondary | ICD-10-CM | POA: Diagnosis not present

## 2018-08-06 DIAGNOSIS — Q446 Cystic disease of liver: Secondary | ICD-10-CM | POA: Diagnosis not present

## 2018-08-06 DIAGNOSIS — R1031 Right lower quadrant pain: Secondary | ICD-10-CM | POA: Diagnosis not present

## 2018-08-06 DIAGNOSIS — R160 Hepatomegaly, not elsewhere classified: Secondary | ICD-10-CM

## 2018-08-06 DIAGNOSIS — K7689 Other specified diseases of liver: Secondary | ICD-10-CM | POA: Insufficient documentation

## 2018-08-06 LAB — CBC WITH DIFFERENTIAL/PLATELET
BASOS ABS: 0 10*3/uL (ref 0.0–0.1)
Basophils Relative: 0 %
EOS ABS: 0.1 10*3/uL (ref 0.0–0.7)
EOS PCT: 1 %
HEMATOCRIT: 36.4 % (ref 36.0–46.0)
HEMOGLOBIN: 12.1 g/dL (ref 12.0–15.0)
Lymphocytes Relative: 29 %
Lymphs Abs: 1.9 10*3/uL (ref 0.7–4.0)
MCH: 32.8 pg (ref 26.0–34.0)
MCHC: 33.2 g/dL (ref 30.0–36.0)
MCV: 98.6 fL (ref 78.0–100.0)
Monocytes Absolute: 0.5 10*3/uL (ref 0.1–1.0)
Monocytes Relative: 8 %
Neutro Abs: 4 10*3/uL (ref 1.7–7.7)
Neutrophils Relative %: 62 %
Platelets: 161 10*3/uL (ref 150–400)
RBC: 3.69 MIL/uL — ABNORMAL LOW (ref 3.87–5.11)
RDW: 11.5 % (ref 11.5–15.5)
WBC: 6.5 10*3/uL (ref 4.0–10.5)

## 2018-08-06 LAB — URINALYSIS, ROUTINE W REFLEX MICROSCOPIC
Bilirubin Urine: NEGATIVE
Glucose, UA: NEGATIVE mg/dL
Hgb urine dipstick: NEGATIVE
Ketones, ur: NEGATIVE mg/dL
Nitrite: NEGATIVE
PH: 5 (ref 5.0–8.0)
Protein, ur: NEGATIVE mg/dL
SPECIFIC GRAVITY, URINE: 1.023 (ref 1.005–1.030)

## 2018-08-06 LAB — COMPREHENSIVE METABOLIC PANEL
ALK PHOS: 85 U/L (ref 38–126)
ALT: 25 U/L (ref 0–44)
AST: 25 U/L (ref 15–41)
Albumin: 3.7 g/dL (ref 3.5–5.0)
Anion gap: 5 (ref 5–15)
BILIRUBIN TOTAL: 0.7 mg/dL (ref 0.3–1.2)
BUN: 15 mg/dL (ref 6–20)
CALCIUM: 8.6 mg/dL — AB (ref 8.9–10.3)
CO2: 28 mmol/L (ref 22–32)
CREATININE: 0.88 mg/dL (ref 0.44–1.00)
Chloride: 104 mmol/L (ref 98–111)
GFR calc non Af Amer: >60 mL/min (ref >60)
Glucose, Bld: 134 mg/dL — ABNORMAL HIGH (ref 70–99)
Potassium: 4.1 mmol/L (ref 3.5–5.1)
Sodium: 137 mmol/L (ref 135–145)
Total Protein: 7.4 g/dL (ref 6.5–8.1)

## 2018-08-06 LAB — LIPASE, BLOOD: Lipase: 20 U/L (ref 11–51)

## 2018-08-06 MED ORDER — SODIUM CHLORIDE 0.9 % IV BOLUS: 500.0000 mL | Freq: Once | INTRAVENOUS | Status: DC

## 2018-08-06 MED ORDER — NITROFURANTOIN MONOHYD MACRO 100 MG PO CAPS: 100.0000 mg | ORAL_CAPSULE | Freq: Two times a day (BID) | ORAL | 0 refills | Status: DC

## 2018-08-06 MED ORDER — NITROFURANTOIN MONOHYD MACRO 100 MG PO CAPS
100.0000 mg | ORAL_CAPSULE | Freq: Once | ORAL | Status: AC
  Administered 2018-08-06: 100 mg via ORAL
  Filled 2018-08-06: qty 1

## 2018-08-06 MED ORDER — ONDANSETRON HCL 4 MG/2ML IJ SOLN
4.0000 mg | Freq: Once | INTRAMUSCULAR | Status: DC
  Filled 2018-08-06: qty 2

## 2018-08-06 NOTE — ED Notes (Signed)
Patient transported to Ultrasound 

## 2018-08-06 NOTE — ED Triage Notes (Signed)
Pt states was diagnosed with UTI on Monday and was allergic to antibiotic they prescribed so she hasn't picked up new prescription

## 2018-08-06 NOTE — Discharge Instructions (Addendum)
The testing today showed a nonspecific abnormality in the liver.  This consists of both fluid and a solid mass.  To evaluate this further we recommend MRI imaging to be done as soon as possible.  Call your primary care doctor in the morning, to arrange that.  You can return here if needed for worsening symptoms.  You may have a urinary tract infection.  We sent a urine culture which will return in about 2 days and tell us for sure if you had an infection.  In the meantime, take the prescription that your doctor sent to your pharmacy, today.  For pain, try using heat on the sore area 2 or 3 times a day and take Tylenol every 4 hours.  Try to drink plenty of fluids and gradually advance your diet as tolerated.  Return here, if needed, for problems.

## 2018-08-06 NOTE — ED Notes (Signed)
Patient refused IV, fluids and intravenous Zofran.

## 2018-08-06 NOTE — Telephone Encounter (Signed)
Pt states that she had an allergic reaction to the bactrim that was prescribed. She states that she took benadryl to help with the rash. She is requesting that something else be prescribed for the infection she had. Advised that I would send her request to a provider and she could check with her pharmacy later today. Pt verbalized understanding.

## 2018-08-06 NOTE — ED Provider Notes (Signed)
[facility] EMERGENCY DEPARTMENT Provider Note   CSN: [id number] Arrival date & time: 08/06/18  1545     History   Chief Complaint Chief Complaint  Patient presents with  . Abdominal Pain    HPI Caitlyn Patterson is a 57 y.o. female.  HPI   Here for evaluation of right upper quadrant abdominal pain which started about 30 minutes prior to arrival and was severe, 10/10 when it started it is now 7/10.  She does not have ongoing dysuria, urinary frequency, nausea, vomiting or fever.  She was diagnosed with UTI, 2 days ago and started on Bactrim yesterday, she stopped after 1 dose because she developed a rash on her left arm.  Today she was able to eat both breakfast and lunch but the pain started after eating chicken fingers.  She denies diarrhea.  There are no other known modifying factors.   Past Medical History:  Diagnosis Date  . BV (bacterial vaginosis)   . Fibroid   . Hematuria 09/20/2015  . Hypertension   . Menopause 07/15/2014  . Pain with urination 09/20/2015  . Rectocele 04/20/2013  . Vaginal dryness 07/15/2014  . Yeast vaginitis 08/06/2013    Patient Active Problem List   Diagnosis Date Noted  . Breast pain, left 11/01/2017  . Chest pain, atypical 11/01/2017  . Constipation 07/10/2017  . Hyperlipidemia LDL goal <100 06/29/2017  . Recent change in frequency of bowel movements 05/14/2017  . Essential hypertension 11/14/2016  . Obesity (BMI 30.0-34.9) 10/02/2014  . Hot flashes, menopausal 10/24/2013  . Fibroids 04/20/2013  . Rectocele 04/20/2013  . Vitamin D deficiency 11/27/2010    Past Surgical History:  Procedure Laterality Date  . COLONOSCOPY  2010   Dr. Oneida Alar: normal, small internal hemorrhoids  . NO PAST SURGERIES    . varicose veins       OB History    Gravida  1   Para      Term      Preterm      AB  1   Living        SAB      TAB  1   Ectopic      Multiple      Live Births               Home Medications    Prior to  Admission medications   Medication Sig Start Date End Date Taking? Authorizing Provider  amLODipine (NORVASC) 2.5 MG tablet TAKE 1 TABLET BY MOUTH  DAILY 07/17/18   Fayrene Helper, MD  ergocalciferol (VITAMIN D2) 50000 units capsule Take 1 capsule (50,000 Units total) by mouth once a week. One capsule once weekly Patient not taking: Reported on 08/03/2018 10/07/17   Fayrene Helper, MD  nitrofurantoin, macrocrystal-monohydrate, (MACROBID) 100 MG capsule Take 1 capsule (100 mg total) by mouth 2 (two) times daily. X 7 days 08/06/18   Roma Schanz, CNM  sulfamethoxazole-trimethoprim (BACTRIM DS,SEPTRA DS) 800-160 MG tablet Take 1 tablet by mouth 2 (two) times daily. X 7 days 08/03/18   Roma Schanz, CNM    Family History Family History  Problem Relation Age of Onset  . Cancer Maternal Grandmother        colon   . Stroke Mother   . Hypertension Mother   . Cancer Maternal Aunt        breast  . Pneumonia Father   . Hypertension Brother   . Hypertension Sister   . Varicose Veins Sister   .  Diabetes Paternal Aunt   . Colon polyps Neg Hx     Social History Social History   Tobacco Use  . Smoking status: Never Smoker  . Smokeless tobacco: Never Used  Substance Use Topics  . Alcohol use: Yes    Alcohol/week: 2.0 - 3.0 standard drinks    Types: 1 - 2 Glasses of wine, 1 Shots of liquor per week    Comment: occ  . Drug use: No     Allergies   Amoxicillin and Ibuprofen   Review of Systems Review of Systems  All other systems reviewed and are negative.    Physical Exam Updated Vital Signs BP 124/80 (BP Location: Left Arm)   Pulse 69   Temp 99.3 F (37.4 C) (Oral)   Resp 18   Ht 5\' 5"  (1.651 m)   Wt 88.5 kg   LMP 07/23/2013   SpO2 97%   BMI 32.45 kg/m   Physical Exam  Constitutional: She is oriented to person, place, and time. She appears well-developed and well-nourished. She does not appear ill. She appears distressed (Uncomfortable).  HENT:    Head: Normocephalic and atraumatic.  Eyes: Pupils are equal, round, and reactive to light. Conjunctivae and EOM are normal.  Neck: Normal range of motion and phonation normal. Neck supple.  Cardiovascular: Normal rate and regular rhythm.  Pulmonary/Chest: Effort normal and breath sounds normal. She exhibits no tenderness.  Abdominal: Soft. She exhibits no distension and no mass. There is tenderness (Right upper quadrant, moderate). There is no rebound and no guarding.  Musculoskeletal: Normal range of motion.  Neurological: She is alert and oriented to person, place, and time. She exhibits normal muscle tone.  Skin: Skin is warm and dry.  Psychiatric: She has a normal mood and affect. Her behavior is normal. Judgment and thought content normal.  Nursing note and vitals reviewed.    ED Treatments / Results  Labs (all labs ordered are listed, but only abnormal results are displayed) Labs Reviewed  URINALYSIS, ROUTINE W REFLEX MICROSCOPIC - Abnormal; Notable for the following components:      Result Value   APPearance HAZY (*)    Leukocytes, UA TRACE (*)    Bacteria, UA RARE (*)    All other components within normal limits  COMPREHENSIVE METABOLIC PANEL - Abnormal; Notable for the following components:   Glucose, Bld 134 (*)    Calcium 8.6 (*)    All other components within normal limits  CBC WITH DIFFERENTIAL/PLATELET - Abnormal; Notable for the following components:   RBC 3.69 (*)    All other components within normal limits  LIPASE, BLOOD    EKG None  Radiology US Abdomen Complete  Result Date: 08/06/2018 CLINICAL DATA:  Right upper and lower quadrant pain since 3 p.m. EXAM: ABDOMEN ULTRASOUND COMPLETE COMPARISON:  None. FINDINGS: Gallbladder: Contracted appearance of the gallbladder which may account for the thickened appearance of the gallbladder wall. The single wall thickness measures up to 4.7 mm. No gallstones. No sonographic Murphy sign noted by sonographer. Common  bile duct: Diameter: 4.1 mm and normal Liver: There is a partially cystic, partially solid appearing masslike abnormality associated with the right hepatic lobe. The cystic component measures approximately 8.2 x 8.2 x 9.8 cm with solid component measuring 6.4 x 3.8 x 6.3 cm. Portal vein is patent on color Doppler imaging with normal direction of blood flow towards the liver. IVC: No abnormality visualized. Pancreas: Visualized portion unremarkable. Spleen: Size and appearance within normal limits. Right Kidney: Length:  9.6 cm. Echogenicity within normal limits. No mass or hydronephrosis visualized. Left Kidney: Length: 10.6 cm. Echogenicity within normal limits. No mass or hydronephrosis visualized. Abdominal aorta: No aneurysm visualized. Other findings: None. IMPRESSION: There is a complex, partially cystic, partially solid appearing mass associated with the right hepatic lobe with the cystic component measuring approximately 8.2 x 8.2 x 9.8 cm and solid posterior component measuring 6.4 x 3.8 x 6.3 cm. This raises concern for possible hepatic neoplasm, abscess or hematoma. Comparison with outside imaging if available may prove useful. Otherwise, suggest MRI with IV contrast with liver lesion protocol. Contracted thick-walled appearance of the gallbladder likely due to underdistention. No gallstones or biliary dilatation is identified. Electronically Signed   By: Ashley Royalty M.D.   On: 08/06/2018 18:46    Procedures Procedures (including critical care time)  Medications Ordered in ED Medications  ondansetron (ZOFRAN) injection 4 mg (4 mg Intravenous Not Given 08/06/18 1939)  sodium chloride 0.9 % bolus 500 mL (500 mLs Intravenous Not Given 08/06/18 1945)  nitrofurantoin (macrocrystal-monohydrate) (MACROBID) capsule 100 mg (100 mg Oral Given 08/06/18 1939)     Initial Impression / Assessment and Plan / ED Course  I have reviewed the triage vital signs and the nursing notes.  Pertinent labs & imaging  results that were available during my care of the patient were reviewed by me and considered in my medical decision making (see chart for details).  Clinical Course as of Aug 06 2012  Thu Aug 06, 2018  1957 Normal  Lipase, blood [EW]  1957 Normal except trace leukocyte, and rare bacteria.  Culture ordered  Urinalysis, Routine w reflex microscopic(!) [EW]  1957 Normal except glucose high, calcium low  Comprehensive metabolic panel(!) [EW]  4944 Normal  CBC with Differential(!) [EW]  2001 Nonspecific liver mass, images reviewed by me.  US Abdomen Complete [EW]    Clinical Course User Index [EW] Daleen Bo, MD     Patient Vitals for the past 24 hrs:  BP Temp Temp src Pulse Resp SpO2 Height Weight  08/06/18 1850 124/80 - - 69 18 97 % - -  08/06/18 1559 - - - - - - 5\' 5"  (1.651 m) 88.5 kg  08/06/18 1558 133/78 99.3 F (37.4 C) Oral 70 16 97 % - -    7:54 PM Reevaluation with update and discussion. After initial assessment and treatment, an updated evaluation reveals she states her pain is better and she is comfortable.  Findings discussed with the patient and all questions answered. Daleen Bo   Medical Decision Making: Nonspecific abdominal pain, evaluated for gallbladder disease, serious bacterial infection; testing revealed incidental allergy, which is nonspecific.  This will require additional evaluation.  Possible UTI, doubt pyelonephritis.  Doubt impending vascular collapse.  Urine culture sent.  CRITICAL CARE-no Performed by: Daleen Bo  Nursing Notes Reviewed/ Care Coordinated Applicable Imaging Reviewed Interpretation of Laboratory Data incorporated into ED treatment  The patient appears reasonably screened and/or stabilized for discharge and I doubt any other medical condition or other [facility] requiring further screening, evaluation, or treatment in the ED at this time prior to discharge.  Plan: [place], called to pharmacy today by PCP,  continue routine medication and use Tylenol as needed; Home Treatments-gradually advance diet; return here if the recommended treatment, does not improve the symptoms; Recommended follow up-PCP, by phone in the morning to arrange outpatient MRI hopefully in the next day or so.  Management proceed after that for possible surgical consultation and biopsy.  Final Clinical Impressions(s) / ED Diagnoses   Final diagnoses:  Right upper quadrant abdominal pain  Liver mass  Liver cyst    ED Discharge Orders    None       Daleen Bo, MD 08/06/18 2014

## 2018-08-06 NOTE — ED Triage Notes (Signed)
Pt states right lower abd pain for past 45 minutes.denies nausea/vomiting. Normal bowel movements per pt.

## 2018-08-06 NOTE — ED Notes (Signed)
Patient transported back from X-ray 

## 2018-08-07 ENCOUNTER — Other Ambulatory Visit: Payer: Self-pay | Admitting: Women's Health

## 2018-08-07 ENCOUNTER — Telehealth: Payer: Self-pay | Admitting: Women's Health

## 2018-08-07 ENCOUNTER — Telehealth: Payer: Self-pay | Admitting: Family Medicine

## 2018-08-07 ENCOUNTER — Other Ambulatory Visit: Payer: Self-pay | Admitting: Family Medicine

## 2018-08-07 ENCOUNTER — Telehealth: Payer: Self-pay | Admitting: Gastroenterology

## 2018-08-07 DIAGNOSIS — R160 Hepatomegaly, not elsewhere classified: Secondary | ICD-10-CM

## 2018-08-07 DIAGNOSIS — R1907 Generalized intra-abdominal and pelvic swelling, mass and lump: Secondary | ICD-10-CM

## 2018-08-07 LAB — URINE CULTURE

## 2018-08-07 MED ORDER — TRAMADOL HCL 50 MG PO TABS: 50.0000 mg | ORAL_TABLET | Freq: Four times a day (QID) | ORAL | 0 refills | Status: DC | PRN

## 2018-08-07 MED ORDER — CIPROFLOXACIN HCL 500 MG PO TABS: 500.0000 mg | ORAL_TABLET | Freq: Two times a day (BID) | ORAL | 0 refills | Status: DC

## 2018-08-07 MED ORDER — LORAZEPAM 0.5 MG PO TABS: ORAL_TABLET | ORAL | 0 refills | Status: DC

## 2018-08-07 NOTE — Telephone Encounter (Signed)
I spoke with the patient pls print  Work excuse from 09/20 to 08/17/2018, someone will collect this   Please schedule an ap[pointment here in November, and remind her of flu vaccine availability  Medications, tramadol and ativan are prescribed, she is aware

## 2018-08-07 NOTE — Telephone Encounter (Signed)
Dusy from Dr Simpson's office called with an [facility] referral regarding a liver mass.Referral is in epic.  She said Dr Moshe Cipro was wanted patient scheduled today. Please advise if I can use an URG spot with a provider. The back line number is [phone]

## 2018-08-07 NOTE — Telephone Encounter (Signed)
Patient is scheduled with Dr.Fields sept 26 at 230 (patient aware) She is requesting pain medication to help her until she can see GI. She is also requesting a work note. Please let me know when both requests are complete so I can call her and let her know.

## 2018-08-07 NOTE — Telephone Encounter (Signed)
OV with SF on 9/26 at 245 and Dusty at PCP is going to call the patient.

## 2018-08-07 NOTE — Telephone Encounter (Signed)
This is taken care of

## 2018-08-07 NOTE — Telephone Encounter (Signed)
Called pt, notified urine cx back, had originally rx'd bactrim- however she had an allergic reaction to it about 17mins after taking 1st pill, so she called office yesterday and I sent in macrobid, urine cx today reveals not sensitive to macrobid- pt had not picked up yet. Rx cipro.  Roma Schanz, CNM, [facility]NP-BC 08/07/2018 2:50 PM

## 2018-08-07 NOTE — Telephone Encounter (Signed)
Central scheduling states MRI order needs to be changed to with & without contrast to be scheduled. ALSO! I called Caitlyn Patterson to schedule appt but we have to get approval from a provider to use an urgent slot.  She has the back line phone # & we will be called  Back shortly.

## 2018-08-07 NOTE — Telephone Encounter (Signed)
Reviewed

## 2018-08-07 NOTE — Progress Notes (Signed)
I have spoken directly with the patient and she is aware of the fact she has a mass on her US liver, re ordred for MRI w/wo contrast , needs gI appt next week asap

## 2018-08-10 ENCOUNTER — Ambulatory Visit (HOSPITAL_COMMUNITY)
Admission: RE | Admit: 2018-08-10 | Discharge: 2018-08-10 | Disposition: A | Discharge status: Home / Self Care | Payer: 59 | Source: Ambulatory Visit | Attending: Family Medicine | Admitting: Family Medicine

## 2018-08-10 ENCOUNTER — Other Ambulatory Visit: Payer: Self-pay

## 2018-08-10 DIAGNOSIS — I10 Essential (primary) hypertension: Secondary | ICD-10-CM

## 2018-08-10 DIAGNOSIS — K7689 Other specified diseases of liver: Secondary | ICD-10-CM | POA: Diagnosis not present

## 2018-08-10 DIAGNOSIS — R160 Hepatomegaly, not elsewhere classified: Secondary | ICD-10-CM | POA: Insufficient documentation

## 2018-08-10 DIAGNOSIS — E669 Obesity, unspecified: Secondary | ICD-10-CM

## 2018-08-10 DIAGNOSIS — R1011 Right upper quadrant pain: Secondary | ICD-10-CM | POA: Diagnosis not present

## 2018-08-10 DIAGNOSIS — E785 Hyperlipidemia, unspecified: Secondary | ICD-10-CM

## 2018-08-10 DIAGNOSIS — E559 Vitamin D deficiency, unspecified: Secondary | ICD-10-CM

## 2018-08-10 MED ORDER — GADOBUTROL 1 MMOL/ML IV SOLN
9.0000 mL | Freq: Once | INTRAVENOUS | Status: AC | PRN
  Administered 2018-08-10: 9 mL via INTRAVENOUS

## 2018-08-10 NOTE — Progress Notes (Unsigned)
Labs ordered.

## 2018-08-11 ENCOUNTER — Telehealth: Payer: Self-pay

## 2018-08-11 DIAGNOSIS — I10 Essential (primary) hypertension: Secondary | ICD-10-CM

## 2018-08-11 DIAGNOSIS — N951 Menopausal and female climacteric states: Secondary | ICD-10-CM

## 2018-08-11 DIAGNOSIS — E785 Hyperlipidemia, unspecified: Secondary | ICD-10-CM

## 2018-08-11 DIAGNOSIS — E559 Vitamin D deficiency, unspecified: Secondary | ICD-10-CM

## 2018-08-11 NOTE — Telephone Encounter (Signed)
Labs ordered per Dr.Simpson  

## 2018-08-13 ENCOUNTER — Encounter: Payer: Self-pay | Admitting: Gastroenterology

## 2018-08-13 ENCOUNTER — Ambulatory Visit (INDEPENDENT_AMBULATORY_CARE_PROVIDER_SITE_OTHER): Payer: Self-pay | Admitting: Gastroenterology

## 2018-08-13 DIAGNOSIS — K7689 Other specified diseases of liver: Secondary | ICD-10-CM

## 2018-08-13 DIAGNOSIS — Z1211 Encounter for screening for malignant neoplasm of colon: Secondary | ICD-10-CM

## 2018-08-13 NOTE — Progress Notes (Signed)
Subjective:    Patient ID: Caitlyn Patterson, female    DOB: 1961/05/14, 57 y.o.   MRN: [id number]  Caitlyn Helper, MD   HPI Driving home LAT Thursday AND HAD SUDDEN ONSET SEVERE RUQ PAIN(DULL, AT ITS WORST 2/10, SORENESS) ASSOCIATED WITH PAIN WITH DEEP BREATH. SOB WHEN SHE TAKES A DEEP BREATH DUE TO PAIN. BMs: GOOD. APPETITE: NONE SINCE LAST THURS AND MAY BE JUST NERVES. TYLENOL MAKES IT FEEL BETTER AND SHE NEVER PICKED UP THE PAIN MEDS.  PT DENIES FEVER, CHILLS, HEMATOCHEZIA, HEMATEMESIS, nausea, vomiting, melena, diarrhea, CHEST PAIN, SHORTNESS OF BREATH,  CHANGE IN BOWEL IN HABITS, constipation, problems swallowing,OR heartburn or indigestion.  Past Medical History:  Diagnosis Date  . BV (bacterial vaginosis)   . Fibroid   . Hematuria 09/20/2015  . Hypertension   . Menopause 07/15/2014  . Pain with urination 09/20/2015  . Rectocele 04/20/2013  . Vaginal dryness 07/15/2014  . Yeast vaginitis 08/06/2013   Past Surgical History:  Procedure Laterality Date  . COLONOSCOPY  2010   Dr. Oneida Alar: normal, small internal hemorrhoids  . NO PAST SURGERIES    . varicose veins     Allergies  Allergen Reactions  . Amoxicillin     REACTION: bumps and red itchy rash  . Ibuprofen     REACTION: rash  . Bactrim [Sulfamethoxazole-Trimethoprim] Rash   Current Outpatient Medications  Medication Sig    . (TYLENOL) 500 MG tablet Take 1,000 mg by mouth every 6 (six) hours PRN    . amLODipine (NORVASC) 2.5 MG tablet TAKE 1 TABLET BY MOUTH  DAILY    . HEALTHY SKIN,HAIR,NAILS DAILY    .      .      .        Family History  Problem Relation Age of Onset  . Cancer Maternal Grandmother        colon   . Stroke Mother   . Hypertension Mother   . Cancer Maternal Aunt        breast  . Pneumonia Father   . Hypertension Brother   . Hypertension Sister   . Varicose Veins Sister   . Diabetes Paternal Aunt   . Colon polyps Neg Hx     Social History   Socioeconomic History  . Marital  status: Married    Spouse name: Not on file  . Number of children: Not on file  . Years of education: Not on file  . Highest education level: Not on file  Occupational History  . Not on file  Social Needs  . Financial resource strain: Not on file  . Food insecurity:    Worry: Not on file    Inability: Not on file  . Transportation needs:    Medical: Not on file    Non-medical: Not on file  Tobacco Use  . Smoking status: Never Smoker  . Smokeless tobacco: Never Used  Substance and Sexual Activity  . Alcohol use: Yes    Alcohol/week: 2.0 - 3.0 standard drinks    Types: 1 - 2 Glasses of wine, 1 Shots of liquor per week    Comment: occ  . Drug use: No  . Sexual activity: Yes    Birth control/protection: Post-menopausal  Lifestyle  . Physical activity:    Days per week: Not on file    Minutes per session: Not on file  . Stress: Not on file  Relationships  . Social connections:    Talks on phone: Not on  file    Gets together: Not on file    Attends religious service: Not on file    Active member of club or organization: Not on file    Attends meetings of clubs or organizations: Not on file    Relationship status: Not on file  Other Topics Concern  . Not on file  Social History Narrative  . Not on file    Review of Systems PER HPI OTHERWISE ALL SYSTEMS ARE NEGATIVE.    Objective:   Physical Exam  Constitutional: She is oriented to person, place, and time. She appears well-developed and well-nourished. No distress.  HENT:  Head: Normocephalic and atraumatic.  Mouth/Throat: Oropharynx is clear and moist. No oropharyngeal exudate.  Eyes: Pupils are equal, round, and reactive to light. No scleral icterus.  Neck: Normal range of motion. Neck supple.  Cardiovascular: Normal rate, regular rhythm and normal heart sounds.  Pulmonary/Chest: Effort normal and breath sounds normal. No respiratory distress.  Abdominal: Soft. Bowel sounds are normal. She exhibits no distension.  There is tenderness. There is no rebound and no guarding.  MILD TTP IN THE RUQ.   Musculoskeletal: She exhibits no edema.  Lymphadenopathy:    She has no cervical adenopathy.  Neurological: She is alert and oriented to person, place, and time.  NO FOCAL DEFICITS  Psychiatric: She has a normal mood and affect.  Vitals reviewed.      Assessment & Plan:

## 2018-08-13 NOTE — Assessment & Plan Note (Signed)
AVERAGE RISK-NO WARNING SIGNS/SYMPTOMS  WILL TRIAGE PT FOR NEXT COLONOSCOPY IN JUN 2020.

## 2018-08-13 NOTE — Patient Instructions (Signed)
CONTINUE TO MONITOR YOUR SYMPTOMS. PLEASE CALL WITH QUESTIONS OR CONCERNS.  I PERSONALLY REVIEWED THE MRI SEP 2019 WITH THE RADIOLOGY DOCTOR TODAY. NO ADDITIONAL WORKUP NEEDED AT THIS TIME FOR YOUR LIVER CYST.  YOU SHOULD HAVE YOUR LIVER ENZYMES CHECKED ONCE A YEAR.  PLEASE CALL WITH QUESTIONS OR CONCERNS.  FOLLOW UP IN THE OFFICE WILL BE SCHEDULED IF NEEDED.

## 2018-08-13 NOTE — Assessment & Plan Note (Signed)
NO WARNING SIGNS/SYMPTOMS  CONTINUE TO MONITOR SYMPTOMS. I PERSONALLY REVIEWED THE MRI SEP 2019 WITH DR. Thornton Papas. NO ADDITIONAL WORKUP NEEDED AT THIS TIME. LIVER PANEL YEARLY.  PLEASE CALL WITH QUESTIONS OR CONCERNS. FOLLOW UP IN THE OFFICE WILL BE SCHEDULED IF NEEDED.

## 2018-08-14 NOTE — Progress Notes (Signed)
CC'D TO PCP °

## 2018-08-26 ENCOUNTER — Ambulatory Visit: Payer: 59 | Admitting: Family Medicine

## 2018-09-02 ENCOUNTER — Other Ambulatory Visit: Payer: 59 | Admitting: Women's Health

## 2018-09-05 ENCOUNTER — Encounter: Payer: Self-pay | Admitting: Family Medicine

## 2018-09-05 ENCOUNTER — Other Ambulatory Visit: Payer: Self-pay | Admitting: Family Medicine

## 2018-09-05 DIAGNOSIS — E785 Hyperlipidemia, unspecified: Secondary | ICD-10-CM | POA: Diagnosis not present

## 2018-09-05 DIAGNOSIS — N951 Menopausal and female climacteric states: Secondary | ICD-10-CM | POA: Diagnosis not present

## 2018-09-05 DIAGNOSIS — I10 Essential (primary) hypertension: Secondary | ICD-10-CM | POA: Diagnosis not present

## 2018-09-05 DIAGNOSIS — R7989 Other specified abnormal findings of blood chemistry: Secondary | ICD-10-CM | POA: Diagnosis not present

## 2018-09-08 ENCOUNTER — Encounter: Payer: Self-pay | Admitting: Family Medicine

## 2018-09-08 ENCOUNTER — Ambulatory Visit (INDEPENDENT_AMBULATORY_CARE_PROVIDER_SITE_OTHER): Payer: 59 | Admitting: Family Medicine

## 2018-09-08 VITALS — BP 110/68 | HR 55 | Resp 12 | Ht 65.0 in | Wt 197.0 lb

## 2018-09-08 DIAGNOSIS — I10 Essential (primary) hypertension: Secondary | ICD-10-CM

## 2018-09-08 DIAGNOSIS — E559 Vitamin D deficiency, unspecified: Secondary | ICD-10-CM

## 2018-09-08 DIAGNOSIS — E669 Obesity, unspecified: Secondary | ICD-10-CM | POA: Diagnosis not present

## 2018-09-08 DIAGNOSIS — R7989 Other specified abnormal findings of blood chemistry: Secondary | ICD-10-CM

## 2018-09-08 DIAGNOSIS — K7689 Other specified diseases of liver: Secondary | ICD-10-CM | POA: Diagnosis not present

## 2018-09-08 DIAGNOSIS — Z23 Encounter for immunization: Secondary | ICD-10-CM

## 2018-09-08 DIAGNOSIS — E785 Hyperlipidemia, unspecified: Secondary | ICD-10-CM

## 2018-09-08 LAB — BASIC METABOLIC PANEL
BUN/Creatinine Ratio: 24 — ABNORMAL HIGH (ref 9–23)
BUN: 21 mg/dL (ref 6–24)
CALCIUM: 9.4 mg/dL (ref 8.7–10.2)
CO2: 22 mmol/L (ref 20–29)
CREATININE: 0.86 mg/dL (ref 0.57–1.00)
Chloride: 103 mmol/L (ref 96–106)
GFR calc Af Amer: 88 mL/min/{1.73_m2} (ref >59)
GFR calc non Af Amer: 76 mL/min/{1.73_m2} (ref >59)
Glucose: 90 mg/dL (ref 65–99)
Potassium: 4.2 mmol/L (ref 3.5–5.2)
Sodium: 142 mmol/L (ref 134–144)

## 2018-09-08 LAB — LIPID PANEL W/O CHOL/HDL RATIO
CHOLESTEROL TOTAL: 184 mg/dL (ref 100–199)
HDL: 67 mg/dL (ref >39)
LDL CALC: 105 mg/dL — AB (ref 0–99)
TRIGLYCERIDES: 60 mg/dL (ref 0–149)
VLDL CHOLESTEROL CAL: 12 mg/dL (ref 5–40)

## 2018-09-08 LAB — SPECIMEN STATUS REPORT

## 2018-09-08 LAB — TSH: TSH: 4.73 u[IU]/mL — ABNORMAL HIGH (ref 0.450–4.500)

## 2018-09-08 LAB — VITAMIN D 25 HYDROXY (VIT D DEFICIENCY, FRACTURES): Vit D, 25-Hydroxy: 18.1 ng/mL — ABNORMAL LOW (ref 30.0–100.0)

## 2018-09-08 NOTE — Patient Instructions (Addendum)
F/U in 5.5 months, for shingrix # 1 at visit call if you need me sooner  Flu vaccine today  Thankful pain was due to a cyst, which only needs to be watched. If you start having daily pain greater than the 2 you report , then you need to contact Dr Oneida Alar , sometimes large cysts can only be relieved by drawing the fluid out  Mindful eating, water only, and daily exercise for 30 minuutes  BP is excellent   Colonoscopy due next year

## 2018-09-12 ENCOUNTER — Encounter: Payer: Self-pay | Admitting: Family Medicine

## 2018-09-12 DIAGNOSIS — R7989 Other specified abnormal findings of blood chemistry: Secondary | ICD-10-CM | POA: Insufficient documentation

## 2018-09-12 NOTE — Assessment & Plan Note (Signed)
Recommend daily OTC vit D3 2000 IU,  rept in 5 months

## 2018-09-12 NOTE — Assessment & Plan Note (Signed)
Pain much improved and down to 2, will f/u as needed , with GI, she is for rept hepatic panel in 12 months per GI

## 2018-09-12 NOTE — Progress Notes (Signed)
Caitlyn Patterson     MRN: [id number]      DOB: 08/29/1961   HPI Caitlyn Patterson is here for follow up and re-evaluation of chronic medical conditions, medication management and review of any available recent lab and radiology data.  Preventive health is updated, specifically  Cancer screening and Immunization.   Recent MRI suggested possibility of abdominal mass, this is fortunately not the case and she has been already evaluated by GI AND IS ESSENTIALLY CLEARED WITH ONLY REPT LIVER ENZYMES Denies significant abdpominal pain at this time  ROS See HPI  Denies recent fever or chills. Denies sinus pressure, nasal congestion, ear pain or sore throat. Denies chest congestion, productive cough or wheezing. Denies chest pains, palpitations and leg swelling Denies  nausea, vomiting,diarrhea or constipation.   Denies dysuria, frequency, hesitancy or incontinence. Denies joint pain, swelling and limitation in mobility. Denies headaches, seizures, numbness, or tingling. Denies depression, anxiety or insomnia. Denies skin break down or rash.   PE BP 110/68   Pulse (!) 55   Resp 12   Ht 5\' 5"  (1.651 m)   Wt 197 lb 0.6 oz (89.4 kg)   LMP 07/23/2013   SpO2 98% Comment: room air  BMI 32.79 kg/m      Patient alert and oriented and in no cardiopulmonary distress.  HEENT: No facial asymmetry, EOMI,   oropharynx pink and moist.  Neck supple no JVD, no mass.  Chest: Clear to auscultation bilaterally.  CVS: S1, S2 no murmurs, no S3.Regular rate.  ABD: Soft non tender. No guarding or rebound , normal BS, no palpable organomegaly or mass  Ext: No edema  MS: Adequate ROM spine, shoulders, hips and knees.  Skin: Intact, no ulcerations or rash noted.  Psych: Good eye contact, normal affect. Memory intact not anxious or depressed appearing.  CNS: CN 2-12 intact, power,  normal throughout.no focal deficits noted.   Assessment & Plan  Essential hypertension Controlled, no change in  medication DASH diet and commitment to daily physical activity for a minimum of 30 minutes discussed and encouraged, as a part of hypertension management. The importance of attaining a healthy weight is also discussed.  BP/Weight 09/08/2018 08/13/2018 08/06/2018 08/03/2018 10/29/2017 02/24/8118 11/22/7827  Systolic BP 562 130 865 784 696 295 284  Diastolic BP 68 82 83 70 80 77 70  Wt. (Lbs) 197.04 194.4 195 197 193 195.8 196.75  BMI 32.79 32.35 32.45 32.78 32.12 32.58 32.74       Liver cyst Pain much improved and down to 2, will f/u as needed , with GI, she is for rept hepatic panel in 12 months per GI  Obesity (BMI 30.0-34.9) Deteriorated. Patient re-educated about  the importance of commitment to a  minimum of 150 minutes of exercise per week.  The importance of healthy food choices with portion control discussed. Encouraged to start a food diary, count calories and to consider  joining a support group. Sample diet sheets offered. Goals set by the patient for the next several months.   Weight /BMI 09/08/2018 08/13/2018 08/06/2018  WEIGHT 197 lb 0.6 oz 194 lb 6.4 oz 195 lb  HEIGHT 5\' 5"  5\' 5"  5\' 5"   BMI 32.79 kg/m2 32.35 kg/m2 32.45 kg/m2      Hyperlipidemia LDL goal <100 Hyperlipidemia:Low fat diet discussed and encouraged.   Lipid Panel  Lab Results  Component Value Date   CHOL 184 09/05/2018   HDL 67 09/05/2018   LDLCALC 105 (H) 09/05/2018   TRIG 60 09/05/2018  CHOLHDL 3.0 06/14/2017   Needs to reduce fried and fatty foods    Vitamin D deficiency Recommend daily OTC vit D3 2000 IU,  rept in 5 months  Abnormal serum thyroid stimulating hormone (TSH) level Rept in 5 months, with T3 and T4, will  need thyroid US if persists

## 2018-09-12 NOTE — Assessment & Plan Note (Signed)
Controlled, no change in medication DASH diet and commitment to daily physical activity for a minimum of 30 minutes discussed and encouraged, as a part of hypertension management. The importance of attaining a healthy weight is also discussed.  BP/Weight 09/08/2018 08/13/2018 08/06/2018 08/03/2018 10/29/2017 05/29/7870 06/20/6724  Systolic BP 500 164 290 379 558 316 742  Diastolic BP 68 82 83 70 80 77 70  Wt. (Lbs) 197.04 194.4 195 197 193 195.8 196.75  BMI 32.79 32.35 32.45 32.78 32.12 32.58 32.74

## 2018-09-12 NOTE — Assessment & Plan Note (Signed)
Rept in 5 months, with T3 and T4, will  need thyroid US if persists

## 2018-09-12 NOTE — Assessment & Plan Note (Signed)
Deteriorated. Patient re-educated about  the importance of commitment to a  minimum of 150 minutes of exercise per week.  The importance of healthy food choices with portion control discussed. Encouraged to start a food diary, count calories and to consider  joining a support group. Sample diet sheets offered. Goals set by the patient for the next several months.   Weight /BMI 09/08/2018 08/13/2018 08/06/2018  WEIGHT 197 lb 0.6 oz 194 lb 6.4 oz 195 lb  HEIGHT 5\' 5"  5\' 5"  5\' 5"   BMI 32.79 kg/m2 32.35 kg/m2 32.45 kg/m2

## 2018-09-12 NOTE — Assessment & Plan Note (Signed)
Hyperlipidemia:Low fat diet discussed and encouraged.   Lipid Panel  Lab Results  Component Value Date   CHOL 184 09/05/2018   HDL 67 09/05/2018   LDLCALC 105 (H) 09/05/2018   TRIG 60 09/05/2018   CHOLHDL 3.0 06/14/2017   Needs to reduce fried and fatty foods

## 2018-09-14 ENCOUNTER — Telehealth: Payer: Self-pay

## 2018-09-14 DIAGNOSIS — I10 Essential (primary) hypertension: Secondary | ICD-10-CM

## 2018-09-14 DIAGNOSIS — R7989 Other specified abnormal findings of blood chemistry: Secondary | ICD-10-CM

## 2018-09-14 NOTE — Telephone Encounter (Signed)
Labs ordered, printed, and mailed to patient per Dr.Simpson

## 2018-10-28 ENCOUNTER — Ambulatory Visit (INDEPENDENT_AMBULATORY_CARE_PROVIDER_SITE_OTHER): Payer: 59 | Admitting: Women's Health

## 2018-10-28 ENCOUNTER — Encounter: Payer: Self-pay | Admitting: Women's Health

## 2018-10-28 VITALS — BP 103/63 | HR 59 | Ht 65.0 in | Wt 195.0 lb

## 2018-10-28 DIAGNOSIS — Z01419 Encounter for gynecological examination (general) (routine) without abnormal findings: Secondary | ICD-10-CM

## 2018-10-28 NOTE — Progress Notes (Signed)
   WELL-WOMAN EXAMINATION Patient name: Caitlyn Patterson MRN [id number]  Date of birth: 08-Jul-1961 Chief Complaint:   Annual Exam  History of Present Illness:   Caitlyn Patterson is a 57 y.o. [id number] African American female being seen today for a routine well-woman exam.  Current complaints: none  PCP: Moshe Cipro      does not desire labs Patient's last menstrual period was 07/23/2013. The current method of family planning is post menopausal status Last pap 12/25/16. Results were: neg w/ -HRHPV Last mammogram: 03/19/18. Results were: normal. Family h/o breast cancer: Yes, maternal aunt in her 16s (now in 42s) Last colonoscopy: 2010. Results were: normal. Family h/o colorectal cancer: Yes, maternal gma (unsure of age at dx) Review of Systems:   Pertinent items are noted in HPI Denies any headaches, blurred vision, fatigue, shortness of breath, chest pain, abdominal pain, abnormal vaginal discharge/itching/odor/irritation, problems with periods, bowel movements, urination, or intercourse unless otherwise stated above. Pertinent History Reviewed:  Reviewed past medical,surgical, social and family history.  Reviewed problem list, medications and allergies. Physical Assessment:   Vitals:   10/28/18 1613  BP: 103/63  Pulse: (!) 59  Weight: 195 lb (88.5 kg)  Height: 5\' 5"  (1.651 m)  Body mass index is 32.45 kg/m.        Physical Examination:   General appearance - well appearing, and in no distress  Mental status - alert, oriented to person, place, and time  Psych:  She has a normal mood and affect  Skin - warm and dry, normal color, no suspicious lesions noted  Chest - effort normal, all lung fields clear to auscultation bilaterally  Heart - normal rate and regular rhythm  Neck:  midline trachea, no thyromegaly or nodules  Breasts - breasts appear normal, no suspicious masses, no skin or nipple changes or  axillary nodes  Abdomen - soft, nontender, nondistended, no masses or  organomegaly  Pelvic - VULVA: normal appearing vulva with no masses, tenderness or lesions  VAGINA: normal appearing vagina with normal color and discharge, no lesions  CERVIX: normal appearing cervix without discharge or lesions, no CMT  Thin prep pap is not done  UTERUS: uterus is felt to be normal size, shape, consistency and nontender   ADNEXA: No adnexal masses or tenderness noted.  Rectal - normal rectal, good sphincter tone, no masses felt. Hemoccult: neg  Extremities:  No swelling or varicosities noted  No results found for this or any previous visit (from the past 24 hour(s)).  Assessment & Plan:  1) Well-Woman Exam  2) Postmenopasual  Labs/procedures today: none  Mammogram May 2020, or sooner if problems Colonoscopy next year per GI, or sooner if problems  No orders of the defined types were placed in this encounter.   Follow-up: Return in about 1 year (around 10/29/2019) for Physical.  Roma Schanz CNM, [facility] 10/28/2018 4:45 PM

## 2018-11-17 ENCOUNTER — Encounter: Payer: Self-pay | Admitting: *Deleted

## 2019-02-19 ENCOUNTER — Other Ambulatory Visit (HOSPITAL_COMMUNITY): Payer: Self-pay | Admitting: Family Medicine

## 2019-02-19 DIAGNOSIS — Z1231 Encounter for screening mammogram for malignant neoplasm of breast: Secondary | ICD-10-CM

## 2019-03-01 ENCOUNTER — Ambulatory Visit: Payer: 59 | Admitting: Family Medicine

## 2019-03-22 ENCOUNTER — Ambulatory Visit (HOSPITAL_COMMUNITY): Payer: 59

## 2019-04-05 ENCOUNTER — Ambulatory Visit: Payer: 59 | Admitting: Family Medicine

## 2019-04-07 ENCOUNTER — Other Ambulatory Visit: Payer: Self-pay

## 2019-04-07 ENCOUNTER — Telehealth: Payer: Self-pay | Admitting: Family Medicine

## 2019-04-07 MED ORDER — AMLODIPINE BESYLATE 2.5 MG PO TABS: 2.5000 mg | ORAL_TABLET | Freq: Every day | ORAL | 1 refills | Status: DC

## 2019-04-07 NOTE — Telephone Encounter (Signed)
Patients med refilled as requested

## 2019-04-07 NOTE — Telephone Encounter (Signed)
Pt LVM needing her BP medication sent to [facility].  She also needs a Follow up visit, I have left a msg

## 2019-04-08 ENCOUNTER — Ambulatory Visit (INDEPENDENT_AMBULATORY_CARE_PROVIDER_SITE_OTHER): Payer: 59 | Admitting: Family Medicine

## 2019-04-08 ENCOUNTER — Encounter: Payer: Self-pay | Admitting: Family Medicine

## 2019-04-08 VITALS — BP 121/80 | Ht 65.0 in | Wt 195.0 lb

## 2019-04-08 DIAGNOSIS — E785 Hyperlipidemia, unspecified: Secondary | ICD-10-CM | POA: Diagnosis not present

## 2019-04-08 DIAGNOSIS — E559 Vitamin D deficiency, unspecified: Secondary | ICD-10-CM | POA: Diagnosis not present

## 2019-04-08 DIAGNOSIS — I10 Essential (primary) hypertension: Secondary | ICD-10-CM | POA: Diagnosis not present

## 2019-04-08 DIAGNOSIS — Z7189 Other specified counseling: Secondary | ICD-10-CM

## 2019-04-08 DIAGNOSIS — G8929 Other chronic pain: Secondary | ICD-10-CM | POA: Insufficient documentation

## 2019-04-08 DIAGNOSIS — M79671 Pain in right foot: Secondary | ICD-10-CM

## 2019-04-08 DIAGNOSIS — E669 Obesity, unspecified: Secondary | ICD-10-CM

## 2019-04-08 MED ORDER — AMLODIPINE BESYLATE 2.5 MG PO TABS: 2.5000 mg | ORAL_TABLET | Freq: Every day | ORAL | 0 refills | Status: DC

## 2019-04-08 NOTE — Progress Notes (Signed)
Virtual Visit via Telephone Note  I connected with Caitlyn Patterson on 04/08/19 at  3:20 PM EDT by telephone and verified that I am speaking with the correct person using two identifiers.  Location: Patient: home Provider: office   I discussed the limitations, risks, security and privacy concerns of performing an evaluation and management service by telephone and the availability of in person appointments. I also discussed with the patient that there may be a patient responsible charge related to this service. The patient expressed understanding and agreed to proceed.   History of Present Illness: 1 month h/o right heel pain , has happened intermittemntly in the past 1 year, rated at a 6, duration about 5 mins, no aggravating trauma Review of needed health screens and immunizations with education as is deficient Denies recent fever or chills. Denies sinus pressure, nasal congestion, ear pain or sore throat. Denies chest congestion, productive cough or wheezing. Denies chest pains, palpitations and leg swelling Denies abdominal pain, nausea, vomiting,diarrhea or constipation.   Denies dysuria, frequency, hesitancy or incontinence.  Denies headaches, seizures, numbness, or tingling. Denies depression, anxiety or insomnia. Denies skin break down or rash.       Observations/Objective: BP 121/80   Ht 5\' 5"  (1.651 m)   Wt 195 lb (88.5 kg)   LMP 07/23/2013   BMI 32.45 kg/m  Good communication with no confusion and intact memory. Alert and oriented x 3 No signs of respiratory distress during sppech    Assessment and Plan: Essential hypertension Controlled, no change in medication DASH diet and commitment to daily physical activity for a minimum of 30 minutes discussed and encouraged, as a part of hypertension management. The importance of attaining a healthy weight is also discussed.  BP/Weight 04/08/2019 10/28/2018 09/08/2018 08/13/2018 08/06/2018 08/03/2018 77/82/4235   Systolic BP 361 443 154 008 676 195 093  Diastolic BP 80 63 68 82 83 70 80  Wt. (Lbs) 195 195 197.04 194.4 195 197 193  BMI 32.45 32.45 32.79 32.35 32.45 32.78 32.12       Hyperlipidemia LDL goal <100 Hyperlipidemia:Low fat diet discussed and encouraged.   Lipid Panel  Lab Results  Component Value Date   CHOL 184 09/05/2018   HDL 67 09/05/2018   LDLCALC 105 (H) 09/05/2018   TRIG 60 09/05/2018   CHOLHDL 3.0 06/14/2017  Updated lab needed at/ before next visit.      Heel pain, chronic, right Current flare but over 1 year h/o intermittent flares, likely heel spur and or plantar fascitis Short course of anti inflammatory, topical icing and stretches, also weight loss and heel cup, education provided aurally and in writing Podiatry referral if persists  Obesity (BMI 30.0-34.9)  Patient re-educated about  the importance of commitment to a  minimum of 150 minutes of exercise per week as able.  The importance of healthy food choices with portion control discussed, as well as eating regularly and within a 12 hour window most days. The need to choose "clean , green" food 50 to 75% of the time is discussed, as well as to make water the primary drink and set a goal of 64 ounces water daily.  Goals set by the patient for the next several months.   Weight /BMI 04/08/2019 10/28/2018 09/08/2018  WEIGHT 195 lb 195 lb 197 lb 0.6 oz  HEIGHT 5\' 5"  5\' 5"  5\' 5"   BMI 32.45 kg/m2 32.45 kg/m2 32.79 kg/m2      Vitamin D deficiency Updated lab needed at/ before next  visit.   taking 800 IU vit D 3  OTC  Educated About Covid-19 Virus Infection Covid-19 Education  The signs and symptoms of of COVID -20 were discussed with the patient and how to seek care for testing. ( follow up with PCP or arrange  E-visit) The importance of social  distancing is discussed today.     Follow Up Instructions:    I discussed the assessment and treatment plan with the patient. The patient was  provided an opportunity to ask questions and all were answered. The patient agreed with the plan and demonstrated an understanding of the instructions.   The patient was advised to call back or seek an in-person evaluation if the symptoms worsen or if the condition fails to improve as anticipated.  I provided 22 minutes of non-face-to-face time during this encounter.   Tula Nakayama, MD

## 2019-04-08 NOTE — Patient Instructions (Addendum)
F/U in 10 weeks, call if you need me before, needs TdaP at visit  Please  consider the shingles  Vaccine, I am sending information about it so that you  Can read about it. Once you decide to get the vaccine, please call to arrange a nurse visit to receive it. It is a 2 part vaccine , vaccines are given 2 to 6 months apart  Please try to increase exercise commitment to 30 minutes 5 days per week  Vitamin d supplement should be 800 IU once daily   Please get fasting lipid, cmp and EGFR, tSH and vit d in the next 1 to 2 weeks  Please call and reschedule your mammogram which is past due , as we discussed  I have enclosed information about heel pain, which is likely due to plantar fascitis  And you may also have a heel spur , this will be reassessed when you come in for your visit, you may call for sooner appointment if needed           Heel Spur  A heel spur is a bony growth that forms on the bottom of the heel bone (calcaneus). Heel spurs are common. They often cause inflammation in the band of tissue that connects the toes to the heel bone (plantar fascia). This may cause pain on the bottom of the foot, near the heel. Many people with plantar fasciitis also have heel spurs. However, spurs are not the cause of plantar fasciitis pain. What are the causes? The exact cause of heel spurs is not known. They may be caused by:  Pressure on the heel bone.  Bands of tissue (tendons) pulling on the heel bone. What increases the risk? You are more likely to develop this condition if you:  Are older than 40.  Are overweight.  Have wear-and-tear arthritis (osteoarthritis).  Have plantar fascia inflammation.  Participate in sports or activities that include a lot of running or jumping.  Wear poorly fitted shoes. What are the signs or symptoms? Some people have no symptoms. If you do have symptoms, they may include:  Pain in the bottom of your heel.  Pain that is worse when you  first get out of bed.  Pain that gets worse after walking or standing. How is this diagnosed? This condition may be diagnosed based on:  Your symptoms and medical history.  A physical exam.  A foot X-ray. How is this treated? Treatment for this condition depends on how much pain you have. Treatment options may include:  Doing stretching exercises.  Losing weight, if necessary.  Wearing specific shoes or inserts inside of shoes (orthotics) for comfort and support.  Wearing splints on your feet while you sleep. Splints keep your feet in a position (usually 90 degrees) that should prevent and relieve the pain you feel when you first get out of bed. They also make stretching easier in the morning.  Taking over-the-counter medicine to relieve pain, such as NSAIDs.  Using high-intensity sound waves to break up the heel spur (extracorporeal shock wave therapy).  Getting steroid injections in your heel to reduce inflammation.  Having surgery, if your heel spur causes long-term (chronic) pain. Follow these instructions at home:  Activity  Avoid activities that cause pain until you recover, or for as long as directed by your health care provider.  Do stretching exercises as directed. Stretch before exercising or being physically active. Managing pain, stiffness, and swelling  If directed, put ice on your foot: ?  Put ice in a plastic bag. ? Place a towel between your skin and the bag. ? Leave the ice on for 20 minutes, 2-3 times a day.  Move your toes often to avoid stiffness and to lessen swelling.  When possible, raise (elevate) your foot above the level of your heart while you are sitting or lying down. General instructions  Take over-the-counter and prescription medicines only as told by your health care provider.  Wear supportive shoes that fit well. Wear splints, inserts, or orthotics as told by your health care provider.  If recommended, work with your health care  provider to lose weight. This can relieve pressure on your foot.  Do not use any products that contain nicotine or tobacco, such as cigarettes and e-cigarettes. These can affect bone growth and healing. If you need help quitting, ask your health care provider.  Keep all follow-up visits as told by your health care provider. This is important. Contact a health care provider if:  Your pain does not go away with treatment.  Your pain gets worse. Summary  A heel spur is a bony growth that forms on the bottom of the heel bone (calcaneus).  Heel spurs often cause inflammation in the band of tissue that connects the toes to the heel bone (plantar fascia). This may cause pain on the bottom of the foot, near the heel.  Doing stretching exercises, losing weight, wearing specific shoes or shoe inserts, wearing splints while you sleep, and taking pain medicine may ease the pain and stiffness.  Other treatment options may include high-intensity sound waves to break up the heel spur, steroid injections, or surgery. This information is not intended to replace advice given to you by your health care provider. Make sure you discuss any questions you have with your health care provider. Document Released: 12/11/2005 Document Revised: 10/22/2017 Document Reviewed: 10/22/2017 Elsevier Interactive Patient Education  2019 [employer]  Plantar Fasciitis  Plantar fasciitis is a painful foot condition that affects the heel. It occurs when the band of tissue that connects the toes to the heel bone (plantar fascia) becomes irritated. This can happen as the result of exercising too much or doing other repetitive activities (overuse injury). The pain from plantar fasciitis can range from mild irritation to severe pain that makes it difficult to walk or move. The pain is usually worse in the morning after sleeping, or after sitting or lying down for a while. Pain may also be worse after long periods of walking or  standing. What are the causes? This condition may be caused by:  Standing for long periods of time.  Wearing shoes that do not have good arch support.  Doing activities that put stress on joints (high-impact activities), including running, aerobics, and ballet.  Being overweight.  An abnormal way of walking (gait).  Tight muscles in the back of your lower leg (calf).  High arches in your feet.  Starting a new athletic activity. What are the signs or symptoms? The main symptom of this condition is heel pain. Pain may:  Be worse with first steps after a time of rest, especially in the morning after sleeping or after you have been sitting or lying down for a while.  Be worse after long periods of standing still.  Decrease after 30-45 minutes of activity, such as gentle walking. How is this diagnosed? This condition may be diagnosed based on your medical history and your symptoms. Your health care provider may ask questions about your activity  level. Your health care provider will do a physical exam to check for:  A tender area on the bottom of your foot.  A high arch in your foot.  Pain when you move your foot.  Difficulty moving your foot. You may have imaging tests to confirm the diagnosis, such as:  X-rays.  Ultrasound.  MRI. How is this treated? Treatment for plantar fasciitis depends on how severe your condition is. Treatment may include:  Rest, ice, applying pressure (compression), and raising the affected foot (elevation). This may be called RICE therapy. Your health care provider may recommend RICE therapy along with over-the-counter pain medicines to manage your pain.  Exercises to stretch your calves and your plantar fascia.  A splint that holds your foot in a stretched, upward position while you sleep (night splint).  Physical therapy to relieve symptoms and prevent problems in the future.  Injections of steroid medicine (cortisone) to relieve pain and  inflammation.  Stimulating your plantar fascia with electrical impulses (extracorporeal shock wave therapy). This is usually the last treatment option before surgery.  Surgery, if other treatments have not worked after 12 months. Follow these instructions at home:  Managing pain, stiffness, and swelling  If directed, put ice on the painful area: ? Put ice in a plastic bag, or use a frozen bottle of water. ? Place a towel between your skin and the bag or bottle. ? Roll the bottom of your foot over the bag or bottle. ? Do this for 20 minutes, 2-3 times a day.  Wear athletic shoes that have air-sole or gel-sole cushions, or try wearing soft shoe inserts that are designed for plantar fasciitis.  Raise (elevate) your foot above the level of your heart while you are sitting or lying down. Activity  Avoid activities that cause pain. Ask your health care provider what activities are safe for you.  Do physical therapy exercises and stretches as told by your health care provider.  Try activities and forms of exercise that are easier on your joints (low-impact). Examples include swimming, water aerobics, and biking. General instructions  Take over-the-counter and prescription medicines only as told by your health care provider.  Wear a night splint while sleeping, if told by your health care provider. Loosen the splint if your toes tingle, become numb, or turn cold and blue.  Maintain a healthy weight, or work with your health care provider to lose weight as needed.  Keep all follow-up visits as told by your health care provider. This is important. Contact a health care provider if you:  Have symptoms that do not go away after caring for yourself at home.  Have pain that gets worse.  Have pain that affects your ability to move or do your daily activities. Summary  Plantar fasciitis is a painful foot condition that affects the heel. It occurs when the band of tissue that connects the  toes to the heel bone (plantar fascia) becomes irritated.  The main symptom of this condition is heel pain that may be worse after exercising too much or standing still for a long time.  Treatment varies, but it usually starts with rest, ice, compression, and elevation (RICE therapy) and over-the-counter medicines to manage pain. This information is not intended to replace advice given to you by your health care provider. Make sure you discuss any questions you have with your health care provider. Document Released: 07/30/2001 Document Revised: 09/01/2017 Document Reviewed: 09/01/2017 Elsevier Interactive Patient Education  2019 [employer].

## 2019-04-12 ENCOUNTER — Encounter: Payer: Self-pay | Admitting: Family Medicine

## 2019-04-12 DIAGNOSIS — Z7189 Other specified counseling: Secondary | ICD-10-CM

## 2019-04-12 HISTORY — DX: Other specified counseling: Z71.89

## 2019-04-12 NOTE — Assessment & Plan Note (Signed)
Hyperlipidemia:Low fat diet discussed and encouraged.   Lipid Panel  Lab Results  Component Value Date   CHOL 184 09/05/2018   HDL 67 09/05/2018   LDLCALC 105 (H) 09/05/2018   TRIG 60 09/05/2018   CHOLHDL 3.0 06/14/2017  Updated lab needed at/ before next visit.

## 2019-04-12 NOTE — Assessment & Plan Note (Signed)
Current flare but over 1 year h/o intermittent flares, likely heel spur and or plantar fascitis Short course of anti inflammatory, topical icing and stretches, also weight loss and heel cup, education provided aurally and in writing Podiatry referral if persists

## 2019-04-12 NOTE — Assessment & Plan Note (Signed)
Updated lab needed at/ before next visit.   taking 800 IU vit D 3  OTC

## 2019-04-12 NOTE — Assessment & Plan Note (Signed)
Controlled, no change in medication DASH diet and commitment to daily physical activity for a minimum of 30 minutes discussed and encouraged, as a part of hypertension management. The importance of attaining a healthy weight is also discussed.  BP/Weight 04/08/2019 10/28/2018 09/08/2018 08/13/2018 08/06/2018 08/03/2018 44/31/5400  Systolic BP 867 619 509 326 712 458 099  Diastolic BP 80 63 68 82 83 70 80  Wt. (Lbs) 195 195 197.04 194.4 195 197 193  BMI 32.45 32.45 32.79 32.35 32.45 32.78 32.12

## 2019-04-12 NOTE — Assessment & Plan Note (Signed)
  Patient re-educated about  the importance of commitment to a  minimum of 150 minutes of exercise per week as able.  The importance of healthy food choices with portion control discussed, as well as eating regularly and within a 12 hour window most days. The need to choose "clean , green" food 50 to 75% of the time is discussed, as well as to make water the primary drink and set a goal of 64 ounces water daily.  Goals set by the patient for the next several months.   Weight /BMI 04/08/2019 10/28/2018 09/08/2018  WEIGHT 195 lb 195 lb 197 lb 0.6 oz  HEIGHT 5\' 5"  5\' 5"  5\' 5"   BMI 32.45 kg/m2 32.45 kg/m2 32.79 kg/m2

## 2019-04-12 NOTE — Assessment & Plan Note (Signed)
Covid-19 Education  The signs and symptoms of of COVID -19 were discussed with the patient and how to seek care for testing. ( follow up with PCP or arrange  E-visit) The importance of social  distancing is discussed today.  

## 2019-05-12 ENCOUNTER — Other Ambulatory Visit: Payer: Self-pay

## 2019-05-12 ENCOUNTER — Ambulatory Visit (HOSPITAL_COMMUNITY)
Admission: RE | Admit: 2019-05-12 | Discharge: 2019-05-12 | Disposition: A | Discharge status: Home / Self Care | Payer: 59 | Source: Ambulatory Visit | Attending: Family Medicine | Admitting: Family Medicine

## 2019-05-12 DIAGNOSIS — Z1231 Encounter for screening mammogram for malignant neoplasm of breast: Secondary | ICD-10-CM | POA: Diagnosis not present

## 2019-06-08 ENCOUNTER — Encounter: Payer: Self-pay | Admitting: Gastroenterology

## 2019-06-22 ENCOUNTER — Ambulatory Visit: Payer: 59 | Admitting: Family Medicine

## 2019-06-24 ENCOUNTER — Other Ambulatory Visit: Payer: Self-pay

## 2019-06-24 ENCOUNTER — Encounter: Payer: Self-pay | Admitting: Family Medicine

## 2019-06-24 ENCOUNTER — Ambulatory Visit (INDEPENDENT_AMBULATORY_CARE_PROVIDER_SITE_OTHER): Payer: 59 | Admitting: Family Medicine

## 2019-06-24 VITALS — BP 114/74 | HR 68 | Resp 15 | Ht 65.0 in | Wt 199.1 lb

## 2019-06-24 DIAGNOSIS — G8929 Other chronic pain: Secondary | ICD-10-CM

## 2019-06-24 DIAGNOSIS — E559 Vitamin D deficiency, unspecified: Secondary | ICD-10-CM

## 2019-06-24 DIAGNOSIS — Z23 Encounter for immunization: Secondary | ICD-10-CM

## 2019-06-24 DIAGNOSIS — M79671 Pain in right foot: Secondary | ICD-10-CM

## 2019-06-24 DIAGNOSIS — I10 Essential (primary) hypertension: Secondary | ICD-10-CM

## 2019-06-24 DIAGNOSIS — E669 Obesity, unspecified: Secondary | ICD-10-CM

## 2019-06-24 DIAGNOSIS — E785 Hyperlipidemia, unspecified: Secondary | ICD-10-CM

## 2019-06-24 MED ORDER — AMLODIPINE BESYLATE 2.5 MG PO TABS: 2.5000 mg | ORAL_TABLET | Freq: Every day | ORAL | 1 refills | Status: DC

## 2019-06-24 NOTE — Patient Instructions (Signed)
F/U with MD in 5 months, call if you need me before  Tdap today and call for flu vaccine in September  Congrats on excellent blood pressure  Please contact Dr Oneida Alar re colonoscopy   Please work on eating habits and avoid snacks in packs  Fasting labs at [facility] asap. Nurse to please add CBC to labs already  Ordered, and give pt new lab order  It is important that you exercise regularly at least 30 minutes 5 times a week. If you develop chest pain, have severe difficulty breathing, or feel very tired, stop exercising immediately and seek medical attention    Think about what you will eat, plan ahead. Choose " clean, green, fresh or frozen" over canned, processed or packaged foods which are more sugary, salty and fatty. 70 to 75% of food eaten should be vegetables and fruit. Three meals at set times with snacks allowed between meals, but they must be fruit or vegetables. Aim to eat over a 12 hour period , example 7 am to 7 pm, and STOP after  your last meal of the day. Drink water,generally about 64 ounces per day, no other drink is as healthy. Fruit juice is best enjoyed in a healthy way, by EATING the fruit.

## 2019-06-25 ENCOUNTER — Encounter: Payer: Self-pay | Admitting: Family Medicine

## 2019-06-25 DIAGNOSIS — Z23 Encounter for immunization: Secondary | ICD-10-CM | POA: Insufficient documentation

## 2019-06-25 NOTE — Assessment & Plan Note (Signed)
improved

## 2019-06-25 NOTE — Assessment & Plan Note (Signed)
After obtaining informed consent, the vaccine is  administered , with no adverse effect noted at the time of administration.  

## 2019-06-25 NOTE — Progress Notes (Signed)
Caitlyn Patterson     MRN: [id number]      DOB: 12/28/1960   HPI Caitlyn Patterson is here for follow up and re-evaluation of chronic medical conditions, medication management and review of any available recent lab and radiology data.  Preventive health is updated, specifically  Cancer screening and Immunization.   Reports improvement in right foot pain, may be a 4 at times. The PT denies any adverse reactions to current medications since the last visit.  There are no new concerns.  Plans to increase exercise and be more consistent with food choice to improve health  ROS Denies recent fever or chills. Denies sinus pressure, nasal congestion, ear pain or sore throat. Denies chest congestion, productive cough or wheezing. Denies chest pains, palpitations and leg swelling Denies abdominal pain, nausea, vomiting,diarrhea or constipation.   Denies dysuria, frequency, hesitancy or incontinence. enies joint pain, swelling and limitation in mobility. Denies headaches, seizures, numbness, or tingling. Denies depression, anxiety or insomnia. Denies skin break down or rash.   PE  BP 114/74   Pulse 68   Resp 15   Ht 5\' 5"  (1.651 m)   Wt 199 lb 1.9 oz (90.3 kg)   LMP 07/23/2013   SpO2 95%   BMI 33.14 kg/m   Patient alert and oriented and in no cardiopulmonary distress.  HEENT: No facial asymmetry, EOMI,   oropharynx pink and moist.  Neck supple no JVD, no mass.  Chest: Clear to auscultation bilaterally.  CVS: S1, S2 no murmurs, no S3.Regular rate.  ABD: Soft non tender.   Ext: No edema  MS: Adequate ROM spine, shoulders, hips and knees.  Skin: Intact, no ulcerations or rash noted.  Psych: Good eye contact, normal affect. Memory intact not anxious or depressed appearing.  CNS: CN 2-12 intact, power,  normal throughout.no focal deficits noted.   Assessment & Plan  Essential hypertension Controlled, no change in medication DASH diet and commitment to daily physical activity  for a minimum of 30 minutes discussed and encouraged, as a part of hypertension management. The importance of attaining a healthy weight is also discussed.  BP/Weight 06/24/2019 04/08/2019 10/28/2018 09/08/2018 08/13/2018 08/06/2018 02/24/8118  Systolic BP 147 829 562 130 865 784 696  Diastolic BP 74 80 63 68 82 83 70  Wt. (Lbs) 199.12 195 195 197.04 194.4 195 197  BMI 33.14 32.45 32.45 32.79 32.35 32.45 32.78       Heel pain, chronic, right improved  Hyperlipidemia LDL goal <100 Hyperlipidemia:Low fat diet discussed and encouraged.   Lipid Panel  Lab Results  Component Value Date   CHOL 184 09/05/2018   HDL 67 09/05/2018   LDLCALC 105 (H) 09/05/2018   TRIG 60 09/05/2018   CHOLHDL 3.0 06/14/2017  Updated lab needed at/ before next visit.      Obesity (BMI 30.0-34.9)  Patient re-educated about  the importance of commitment to a  minimum of 150 minutes of exercise per week as able.  The importance of healthy food choices with portion control discussed, as well as eating regularly and within a 12 hour window most days. The need to choose "clean , green" food 50 to 75% of the time is discussed, as well as to make water the primary drink and set a goal of 64 ounces water daily.    Weight /BMI 06/24/2019 04/08/2019 10/28/2018  WEIGHT 199 lb 1.9 oz 195 lb 195 lb  HEIGHT 5\' 5"  5\' 5"  5\' 5"   BMI 33.14 kg/m2 32.45 kg/m2 32.45 kg/m2  Need for Tdap vaccination After obtaining informed consent, the vaccine is  administered , with no adverse effect noted at the time of administration.

## 2019-06-25 NOTE — Assessment & Plan Note (Signed)
Controlled, no change in medication DASH diet and commitment to daily physical activity for a minimum of 30 minutes discussed and encouraged, as a part of hypertension management. The importance of attaining a healthy weight is also discussed.  BP/Weight 06/24/2019 04/08/2019 10/28/2018 09/08/2018 08/13/2018 08/06/2018 1/65/7903  Systolic BP 833 383 291 916 606 004 599  Diastolic BP 74 80 63 68 82 83 70  Wt. (Lbs) 199.12 195 195 197.04 194.4 195 197  BMI 33.14 32.45 32.45 32.79 32.35 32.45 32.78

## 2019-06-25 NOTE — Assessment & Plan Note (Signed)
Hyperlipidemia:Low fat diet discussed and encouraged.   Lipid Panel  Lab Results  Component Value Date   CHOL 184 09/05/2018   HDL 67 09/05/2018   LDLCALC 105 (H) 09/05/2018   TRIG 60 09/05/2018   CHOLHDL 3.0 06/14/2017  Updated lab needed at/ before next visit.

## 2019-06-25 NOTE — Assessment & Plan Note (Signed)
  Patient re-educated about  the importance of commitment to a  minimum of 150 minutes of exercise per week as able.  The importance of healthy food choices with portion control discussed, as well as eating regularly and within a 12 hour window most days. The need to choose "clean , green" food 50 to 75% of the time is discussed, as well as to make water the primary drink and set a goal of 64 ounces water daily.    Weight /BMI 06/24/2019 04/08/2019 10/28/2018  WEIGHT 199 lb 1.9 oz 195 lb 195 lb  HEIGHT 5\' 5"  5\' 5"  5\' 5"   BMI 33.14 kg/m2 32.45 kg/m2 32.45 kg/m2

## 2019-06-28 ENCOUNTER — Other Ambulatory Visit: Payer: Self-pay | Admitting: Family Medicine

## 2019-06-29 ENCOUNTER — Encounter: Payer: Self-pay | Admitting: Family Medicine

## 2019-06-29 LAB — COMPLETE METABOLIC PANEL WITH GFR
AG Ratio: 1.5 (calc) (ref 1.0–2.5)
ALT: 13 U/L (ref 6–29)
AST: 17 U/L (ref 10–35)
Albumin: 4.3 g/dL (ref 3.6–5.1)
Alkaline phosphatase (APISO): 55 U/L (ref 37–153)
BUN/Creatinine Ratio: 28 (calc) — ABNORMAL HIGH (ref 6–22)
BUN: 27 mg/dL — ABNORMAL HIGH (ref 7–25)
CO2: 28 mmol/L (ref 20–32)
Calcium: 9.3 mg/dL (ref 8.6–10.4)
Chloride: 103 mmol/L (ref 98–110)
Creat: 0.97 mg/dL (ref 0.50–1.05)
GFR, Est African American: 76 mL/min/{1.73_m2} (ref >=60)
GFR, Est Non African American: 65 mL/min/{1.73_m2} (ref >=60)
Globulin: 2.9 g/dL (calc) (ref 1.9–3.7)
Glucose, Bld: 90 mg/dL (ref 65–99)
Potassium: 4.2 mmol/L (ref 3.5–5.3)
Sodium: 138 mmol/L (ref 135–146)
Total Bilirubin: 0.5 mg/dL (ref 0.2–1.2)
Total Protein: 7.2 g/dL (ref 6.1–8.1)

## 2019-06-29 LAB — CBC
HCT: 37.5 % (ref 35.0–45.0)
Hemoglobin: 12.7 g/dL (ref 11.7–15.5)
MCH: 32.6 pg (ref 27.0–33.0)
MCHC: 33.9 g/dL (ref 32.0–36.0)
MCV: 96.2 fL (ref 80.0–100.0)
MPV: 10.8 fL (ref 7.5–12.5)
Platelets: 161 10*3/uL (ref 140–400)
RBC: 3.9 10*6/uL (ref 3.80–5.10)
RDW: 11.8 % (ref 11.0–15.0)
WBC: 5.6 10*3/uL (ref 3.8–10.8)

## 2019-06-29 LAB — LIPID PANEL
Cholesterol: 197 mg/dL (ref <200)
HDL: 58 mg/dL (ref >=50)
LDL Cholesterol (Calc): 121 mg/dL (calc) — ABNORMAL HIGH
Non-HDL Cholesterol (Calc): 139 mg/dL (calc) — ABNORMAL HIGH (ref <130)
Total CHOL/HDL Ratio: 3.4 (calc) (ref <5.0)
Triglycerides: 81 mg/dL (ref <150)

## 2019-06-29 LAB — VITAMIN D 25 HYDROXY (VIT D DEFICIENCY, FRACTURES): Vit D, 25-Hydroxy: 26 ng/mL — ABNORMAL LOW (ref 30–100)

## 2019-06-29 LAB — TSH: TSH: 4.32 mIU/L (ref 0.40–4.50)

## 2019-08-04 ENCOUNTER — Ambulatory Visit (INDEPENDENT_AMBULATORY_CARE_PROVIDER_SITE_OTHER): Payer: 59

## 2019-08-04 ENCOUNTER — Other Ambulatory Visit: Payer: Self-pay

## 2019-08-04 DIAGNOSIS — Z23 Encounter for immunization: Secondary | ICD-10-CM | POA: Diagnosis not present

## 2019-08-18 ENCOUNTER — Ambulatory Visit (INDEPENDENT_AMBULATORY_CARE_PROVIDER_SITE_OTHER): Payer: Self-pay | Admitting: *Deleted

## 2019-08-18 ENCOUNTER — Other Ambulatory Visit: Payer: Self-pay

## 2019-08-18 DIAGNOSIS — Z1211 Encounter for screening for malignant neoplasm of colon: Secondary | ICD-10-CM

## 2019-08-18 NOTE — Progress Notes (Addendum)
Gastroenterology Pre-Procedure Review  Request Date: 08/18/2019 Requesting Physician: 10 year recall, Last TCS 05/15/2009 done by Dr. Oneida Alar, no polyps  PATIENT REVIEW QUESTIONS: The patient responded to the following health history questions as indicated:    1. Diabetes Melitis: no 2. Joint replacements in the past 12 months: no 3. Major health problems in the past 3 months: no 4. Has an artificial valve or MVP: no 5. Has a defibrillator: no 6. Has been advised in past to take antibiotics in advance of a procedure like teeth cleaning: no 7. Family history of colon cancer: yes, thinks her grandmother had it, age: 91's 8. Alcohol Use: yes, 2 glasses of wine a month 9. Illicit drug Use: no 10. History of sleep apnea: no 11. History of coronary artery or other vascular stents placed within the last 12 months: no 12. History of any prior anesthesia complications: no 13. There is no height or weight on file to calculate BMI. ht: 5'5 wt: 190 lbs    MEDICATIONS & ALLERGIES:    Patient reports the following regarding taking any blood thinners:   Plavix? no Aspirin? no Coumadin? no Brilinta? no Xarelto? no Eliquis? no Pradaxa? no Savaysa? no Effient? no  Patient confirms/reports the following medications:  Current Outpatient Medications  Medication Sig Dispense Refill  . acetaminophen (TYLENOL) 500 MG tablet Take 1,000 mg by mouth as needed.     Marland Kitchen amLODipine (NORVASC) 2.5 MG tablet Take 1 tablet (2.5 mg total) by mouth daily. 90 tablet 1  . Ergocalciferol (VITAMIN D2) 50 MCG (2000 UT) TABS Take by mouth daily.     No current facility-administered medications for this visit.     Patient confirms/reports the following allergies:  Allergies  Allergen Reactions  . Amoxicillin     REACTION: bumps and red itchy rash  . Ibuprofen     REACTION: rash  . Bactrim [Sulfamethoxazole-Trimethoprim] Rash    No orders of the defined types were placed in this encounter.   AUTHORIZATION  INFORMATION Primary Insurance:[employer] ,  ID #: [id number],  Group #: [id number] Pre-Cert / Auth required: Yes, approved online 12/01/2019- [id number] Pre-Cert / Josem Kaufmann #: [id number]  SCHEDULE INFORMATION: Procedure has been scheduled as follows:  Date:12/01/2019, Time: 12:00 Location: [facility] with Dr. Oneida Alar  This Gastroenterology Pre-Precedure Review Form is being routed to the following provider(s): Roseanne Kaufman, NP

## 2019-08-18 NOTE — Progress Notes (Signed)
Pt requested to wait for a Jan procedure due to work schedule.  Pt aware that I will call her back once it is available.  Pt voiced understanding.

## 2019-08-20 NOTE — Progress Notes (Signed)
Appropriate.

## 2019-08-25 ENCOUNTER — Telehealth: Payer: Self-pay | Admitting: *Deleted

## 2019-08-25 NOTE — Telephone Encounter (Signed)
Called pt and left her a message to call me back so we can get her scheduled for a Jan procedure.

## 2019-08-26 ENCOUNTER — Telehealth: Payer: Self-pay | Admitting: *Deleted

## 2019-08-26 NOTE — Telephone Encounter (Signed)
Lmom for pt to call me back.  Would like to get pt scheduled for a procedure in Jan.

## 2019-08-27 NOTE — Telephone Encounter (Signed)
Lmom of home and cell number for pt to call me back.

## 2019-08-30 ENCOUNTER — Encounter: Payer: Self-pay | Admitting: *Deleted

## 2019-08-30 MED ORDER — NA SULFATE-K SULFATE-MG SULF 17.5-3.13-1.6 GM/177ML PO SOLN: 1.0000 | Freq: Once | ORAL | 0 refills | Status: AC

## 2019-08-30 NOTE — Addendum Note (Signed)
Addended by: Metro Kung on: 08/30/2019 04:41 PM   Modules accepted: Orders

## 2019-08-30 NOTE — Progress Notes (Signed)
Called pt and she scheduled her procedure for 12/01/2019.  Pt aware that I will mail out her prep instructions and Covid screening info.  Pt aware to pick up her prep kit at the pharmacy before her scheduled procedure.  Routing to AB since she signed off on original triage.

## 2019-08-30 NOTE — Telephone Encounter (Signed)
Lmom for pt to call me back.  Mailing out letter for pt to contact our office.

## 2019-08-30 NOTE — Patient Instructions (Signed)
Caitlyn Patterson  02-23-1961 MRN: [id number]     Procedure Date: 12/01/2019 Time to register: 11:00 am Place to register: [facility] Procedure Time: 12:00 pm Scheduled provider: Dr. Oneida Alar    PREPARATION FOR COLONOSCOPY WITH SUPREP BOWEL PREP KIT  Note: Suprep Bowel Prep Kit is a split-dose (2day) regimen. Consumption of BOTH 6-ounce bottles is required for a complete prep.  Please notify us immediately if you are diabetic, take iron supplements, or if you are on Coumadin or any other blood thinners.                                                                                                                                                 1 DAY BEFORE PROCEDURE:  DATE: 11/30/2019   DAY: Tuesday Continue clear liquids the entire day - NO SOLID FOOD.    At 6:00pm: Complete steps 1 through 4 below, using ONE (1) 6-ounce bottle, before going to bed. Step 1:  Pour ONE (1) 6-ounce bottle of SUPREP liquid into the mixing container.  Step 2:  Add cool drinking water to the 16 ounce line on the container and mix.  Note: Dilute the solution concentrate as directed prior to use. Step 3:  DRINK ALL the liquid in the container. Step 4:  You MUST drink an additional two (2) or more 16 ounce containers of water over the next one (1) hour.   Continue clear liquids.  DAY OF PROCEDURE:   DATE:   12/01/2019 DAY: Wednesday If you take medications for your heart, blood pressure, or breathing, you may take these medications.    5 hours before your procedure at: 7:00 am  Step 1:  Pour ONE (1) 6-ounce bottle of SUPREP liquid into the mixing container.  Step 2:  Add cool drinking water to the 16 ounce line on the container and mix.  Note: Dilute the solution concentrate as directed prior to use. Step 3:  DRINK ALL the liquid in the container. Step 4:  You MUST drink an additional two (2) or more 16 ounce containers of water over the next one (1) hour. You MUST complete the final glass of  water at least 3 hours before your colonoscopy. Nothing by mouth past 9:00 am  You may take your morning medications with sip of water unless we have instructed otherwise.    Please see below for Dietary Information.  CLEAR LIQUIDS INCLUDE:  Water Jello (NOT red in color)   Ice Popsicles (NOT red in color)   Tea (sugar ok, no milk/cream) Powdered fruit flavored drinks  Coffee (sugar ok, no milk/cream) Gatorade/ Lemonade/ Kool-Aid  (NOT red in color)   Juice: apple, white grape, white cranberry Soft drinks  Clear bullion, consomme, broth (fat free beef/chicken/vegetable)  Carbonated beverages (any kind)  Strained chicken noodle soup Hard Candy   Remember: Clear liquids  are liquids that will allow you to see your fingers on the other side of a clear glass. Be sure liquids are NOT red in color, and not cloudy, but CLEAR.  DO NOT EAT OR DRINK ANY OF THE FOLLOWING:  Dairy products of any kind   Cranberry juice Tomato juice / V8 juice   Grapefruit juice Orange juice     Red grape juice  Do not eat any solid foods, including such foods as: cereal, oatmeal, yogurt, fruits, vegetables, creamed soups, eggs, bread, crackers, pureed foods in a blender, etc.   HELPFUL HINTS FOR DRINKING PREP SOLUTION:   Make sure prep is extremely cold. Mix and refrigerate the the morning of the prep. You may also put in the freezer.   You may try mixing some Crystal Light or Country Time Lemonade if you prefer. Mix in small amounts; add more if necessary.  Try drinking through a straw  Rinse mouth with water or a mouthwash between glasses, to remove after-taste.  Try sipping on a cold beverage /ice/ popsicles between glasses of prep.  Place a piece of sugar-free hard candy in mouth between glasses.  If you become nauseated, try consuming smaller amounts, or stretch out the time between glasses. Stop for 30-60 minutes, then slowly start back drinking.     OTHER INSTRUCTIONS  You will need a  responsible adult at least 57 years of age to accompany you and drive you home. This person must remain in the waiting room during your procedure. The hospital will cancel your procedure if you do not have a responsible adult with you.   1. Wear loose fitting clothing that is easily removed. 2. Leave jewelry and other valuables at home.  3. Remove all body piercing jewelry and leave at home. 4. Total time from sign-in until discharge is approximately 2-3 hours. 5. You should go home directly after your procedure and rest. You can resume normal activities the day after your procedure. 6. The day of your procedure you should not:  Drive  Make legal decisions  Operate machinery  Drink alcohol  Return to work   You may call the office (Dept: [phone]) before 5:00pm, or page the doctor on call [phone]) after 5:00pm, for further instructions, if necessary.   Insurance Information YOU WILL NEED TO CHECK WITH YOUR INSURANCE COMPANY FOR THE BENEFITS OF COVERAGE YOU HAVE FOR THIS PROCEDURE.  UNFORTUNATELY, NOT ALL INSURANCE COMPANIES HAVE BENEFITS TO COVER ALL OR PART OF THESE TYPES OF PROCEDURES.  IT IS YOUR RESPONSIBILITY TO CHECK YOUR BENEFITS, HOWEVER, WE WILL BE GLAD TO ASSIST YOU WITH ANY CODES YOUR INSURANCE COMPANY MAY NEED.    PLEASE NOTE THAT MOST INSURANCE COMPANIES WILL NOT COVER A SCREENING COLONOSCOPY FOR PEOPLE UNDER THE AGE OF 50  IF YOU HAVE BCBS INSURANCE, YOU MAY HAVE BENEFITS FOR A SCREENING COLONOSCOPY BUT IF POLYPS ARE FOUND THE DIAGNOSIS WILL CHANGE AND THEN YOU MAY HAVE A DEDUCTIBLE THAT WILL NEED TO BE MET. SO PLEASE MAKE SURE YOU CHECK YOUR BENEFITS FOR A SCREENING COLONOSCOPY AS WELL AS A DIAGNOSTIC COLONOSCOPY.

## 2019-08-31 NOTE — Addendum Note (Signed)
Addended by: Metro Kung on: 08/31/2019 08:53 AM   Modules accepted: Orders, SmartSet

## 2019-11-20 ENCOUNTER — Other Ambulatory Visit: Payer: Self-pay | Admitting: Family Medicine

## 2019-11-24 ENCOUNTER — Encounter: Payer: Self-pay | Admitting: Family Medicine

## 2019-11-24 ENCOUNTER — Other Ambulatory Visit: Payer: Self-pay

## 2019-11-24 ENCOUNTER — Ambulatory Visit (INDEPENDENT_AMBULATORY_CARE_PROVIDER_SITE_OTHER): Payer: 59 | Admitting: Family Medicine

## 2019-11-24 VITALS — BP 127/80 | HR 68 | Resp 15 | Ht 65.0 in | Wt 195.0 lb

## 2019-11-24 DIAGNOSIS — Z1231 Encounter for screening mammogram for malignant neoplasm of breast: Secondary | ICD-10-CM

## 2019-11-24 DIAGNOSIS — E559 Vitamin D deficiency, unspecified: Secondary | ICD-10-CM | POA: Diagnosis not present

## 2019-11-24 DIAGNOSIS — E669 Obesity, unspecified: Secondary | ICD-10-CM | POA: Diagnosis not present

## 2019-11-24 DIAGNOSIS — Z01419 Encounter for gynecological examination (general) (routine) without abnormal findings: Secondary | ICD-10-CM

## 2019-11-24 DIAGNOSIS — I10 Essential (primary) hypertension: Secondary | ICD-10-CM | POA: Diagnosis not present

## 2019-11-24 DIAGNOSIS — E785 Hyperlipidemia, unspecified: Secondary | ICD-10-CM | POA: Diagnosis not present

## 2019-11-24 NOTE — Progress Notes (Signed)
Virtual Visit via Telephone Note  I connected with Garry Heater on 11/24/19 at  2:00 PM EST by telephone and verified that I am speaking with the correct person using two identifiers.  Location: Patient: home Provider: office   I discussed the limitations, risks, security and privacy concerns of performing an evaluation and management service by telephone and the availability of in person appointments. I also discussed with the patient that there may be a patient responsible charge related to this service. The patient expressed understanding and agreed to proceed.   History of Present Illness: F/u chronic problems and medication management  Update health maintenance Feels well and has no concerns or complaints Takes medication faithfully, no adverse s/e  Intends to/ needs to commit to more regular exercise Denies recent fever or chills. Denies sinus pressure, nasal congestion, ear pain or sore throat. Denies chest congestion, productive cough or wheezing. Denies chest pains, palpitations and leg swelling Denies abdominal pain, nausea, vomiting,diarrhea or constipation.   Denies dysuria, frequency, hesitancy or incontinence. Denies joint pain, swelling and limitation in mobility. Denies headaches, seizures, numbness, or tingling. Denies depression, anxiety or insomnia. Denies skin break down or rash.       Observations/Objective: BP 127/80   Pulse 68   Resp 15   Ht 5\' 5"  (1.651 m)   Wt 199 lb (90.3 kg)   LMP 07/23/2013   BMI 33.12 kg/m  Good communication with no confusion and intact memory. Alert and oriented x 3 No signs of respiratory distress during speech    Assessment and Plan: Essential hypertension Controlled, no change in medication DASH diet and commitment to daily physical activity for a minimum of 30 minutes discussed and encouraged, as a part of hypertension management. The importance of attaining a healthy weight is also discussed.  BP/Weight  11/24/2019 06/24/2019 04/08/2019 10/28/2018 09/08/2018 08/13/2018 [id number]  Systolic BP [id number] A999333 [id number]  Diastolic BP 80 74 80 63 68 82 83  Wt. (Lbs) 195 199.12 195 195 197.04 194.4 195  BMI 32.45 33.14 32.45 32.45 32.79 32.35 32.45       Vitamin D deficiency Continue daily supplement Updated lab needed at/ before next visit.   Obesity (BMI 30.0-34.9)  Patient re-educated about  the importance of commitment to a  minimum of 150 minutes of exercise per week as able.  The importance of healthy food choices with portion control discussed, as well as eating regularly and within a 12 hour window most days. The need to choose "clean , green" food 50 to 75% of the time is discussed, as well as to make water the primary drink and set a goal of 64 ounces water daily.    Weight /BMI 11/24/2019 06/24/2019 04/08/2019  WEIGHT 195 lb 199 lb 1.9 oz 195 lb  HEIGHT 5\' 5"  5\' 5"  5\' 5"   BMI 32.45 kg/m2 33.14 kg/m2 32.45 kg/m2      Hyperlipidemia LDL goal <100 Hyperlipidemia:Low fat diet discussed and encouraged.   Lipid Panel  Lab Results  Component Value Date   CHOL 197 06/28/2019   HDL 58 06/28/2019   LDLCALC 121 (H) 06/28/2019   TRIG 81 06/28/2019   CHOLHDL 3.4 06/28/2019   Updated lab needed at/ before next visit.Needs to reduce fat intake, elevated LDL       Follow Up Instructions:    I discussed the assessment and treatment plan with the patient. The patient was provided an opportunity to ask questions and all were answered. The patient  agreed with the plan and demonstrated an understanding of the instructions.   The patient was advised to call back or seek an in-person evaluation if the symptoms worsen or if the condition fails to improve as anticipated.  I provided 20 minutes of non-face-to-face time during this encounter.   Tula Nakayama, MD

## 2019-11-24 NOTE — Patient Instructions (Addendum)
F/U in office with MD early September, call if you need me before  You are referred for your gyne exam with pap, which is due in feb, please call for your appointment if you have no word from that office in the next week  Please call in June for lab order to be sent for your September follow up  All the best with your upcoming colonoscopy  Mammogram is due June 25 or after will schedule  It is important that you exercise regularly at least 30 minutes 5 times a week. If you develop chest pain, have severe difficulty breathing, or feel very tired, stop exercising immediately and seek medical attention   Think about what you will eat, plan ahead. Choose " clean, green, fresh or frozen" over canned, processed or packaged foods which are more sugary, salty and fatty. 70 to 75% of food eaten should be vegetables and fruit. Three meals at set times with snacks allowed between meals, but they must be fruit or vegetables. Aim to eat over a 12 hour period , example 7 am to 7 pm, and STOP after  your last meal of the day. Drink water,generally about 64 ounces per day, no other drink is as healthy. Fruit juice is best enjoyed in a healthy way, by EATING the fruit.  Weight loss goal of 10 to 12 pounds  Thanks for choosing [facility], we consider it a privelige to serve you. Best for 2021!

## 2019-11-25 ENCOUNTER — Telehealth: Payer: Self-pay | Admitting: *Deleted

## 2019-11-25 NOTE — Telephone Encounter (Signed)
Called pt back and she said that [facility] didn't have her RX.  I called [facility] and they are ordering the Suprep for pt.  They didn't have it in stock right now.  The representative said that she thought they should have it before her procedure.  Called pt back and informed her of the information and to check back with [facility] in a couple of days.  Pt voiced understanding.

## 2019-11-25 NOTE — Telephone Encounter (Signed)
[phone] patient called and said that her pharmacy does not have her prep   Please call her

## 2019-11-26 ENCOUNTER — Encounter: Payer: Self-pay | Admitting: Family Medicine

## 2019-11-26 NOTE — Assessment & Plan Note (Signed)
  Patient re-educated about  the importance of commitment to a  minimum of 150 minutes of exercise per week as able.  The importance of healthy food choices with portion control discussed, as well as eating regularly and within a 12 hour window most days. The need to choose "clean , green" food 50 to 75% of the time is discussed, as well as to make water the primary drink and set a goal of 64 ounces water daily.    Weight /BMI 11/24/2019 06/24/2019 04/08/2019  WEIGHT 195 lb 199 lb 1.9 oz 195 lb  HEIGHT 5\' 5"  5\' 5"  5\' 5"   BMI 32.45 kg/m2 33.14 kg/m2 32.45 kg/m2

## 2019-11-26 NOTE — Assessment & Plan Note (Signed)
Continue daily supplement Updated lab needed at/ before next visit.

## 2019-11-26 NOTE — Assessment & Plan Note (Signed)
Hyperlipidemia:Low fat diet discussed and encouraged.   Lipid Panel  Lab Results  Component Value Date   CHOL 197 06/28/2019   HDL 58 06/28/2019   LDLCALC 121 (H) 06/28/2019   TRIG 81 06/28/2019   CHOLHDL 3.4 06/28/2019   Updated lab needed at/ before next visit.Needs to reduce fat intake, elevated LDL

## 2019-11-26 NOTE — Assessment & Plan Note (Signed)
Controlled, no change in medication DASH diet and commitment to daily physical activity for a minimum of 30 minutes discussed and encouraged, as a part of hypertension management. The importance of attaining a healthy weight is also discussed.  BP/Weight 11/24/2019 06/24/2019 04/08/2019 10/28/2018 09/08/2018 08/13/2018 [id number]  Systolic BP [id number] A999333 [id number] A999333  Diastolic BP 80 74 80 63 68 82 83  Wt. (Lbs) 195 199.12 195 195 197.04 194.4 195  BMI 32.45 33.14 32.45 32.45 32.79 32.35 32.45

## 2019-11-29 ENCOUNTER — Other Ambulatory Visit: Payer: Self-pay

## 2019-11-29 ENCOUNTER — Other Ambulatory Visit (HOSPITAL_COMMUNITY)
Admission: RE | Admit: 2019-11-29 | Discharge: 2019-11-29 | Disposition: A | Discharge status: Home / Self Care | Payer: 59 | Source: Ambulatory Visit | Attending: Gastroenterology | Admitting: Gastroenterology

## 2019-11-29 DIAGNOSIS — Z20822 Contact with and (suspected) exposure to covid-19: Secondary | ICD-10-CM | POA: Insufficient documentation

## 2019-11-29 DIAGNOSIS — Z01812 Encounter for preprocedural laboratory examination: Secondary | ICD-10-CM | POA: Diagnosis not present

## 2019-11-29 LAB — SARS CORONAVIRUS 2 (TAT 6-24 HRS): SARS Coronavirus 2: NEGATIVE

## 2019-12-01 ENCOUNTER — Encounter (HOSPITAL_COMMUNITY): Payer: Self-pay | Admitting: Gastroenterology

## 2019-12-01 ENCOUNTER — Encounter (HOSPITAL_COMMUNITY)
Admission: RE | Disposition: A | Discharge status: Home / Self Care | Payer: Self-pay | Source: Home / Self Care | Attending: Gastroenterology

## 2019-12-01 ENCOUNTER — Other Ambulatory Visit: Payer: Self-pay

## 2019-12-01 ENCOUNTER — Ambulatory Visit (HOSPITAL_COMMUNITY)
Admission: RE | Admit: 2019-12-01 | Discharge: 2019-12-01 | Disposition: A | Discharge status: Home / Self Care | Payer: 59 | Attending: Gastroenterology | Admitting: Gastroenterology

## 2019-12-01 DIAGNOSIS — Z836 Family history of other diseases of the respiratory system: Secondary | ICD-10-CM | POA: Insufficient documentation

## 2019-12-01 DIAGNOSIS — D123 Benign neoplasm of transverse colon: Secondary | ICD-10-CM | POA: Insufficient documentation

## 2019-12-01 DIAGNOSIS — Z79899 Other long term (current) drug therapy: Secondary | ICD-10-CM | POA: Diagnosis not present

## 2019-12-01 DIAGNOSIS — Z803 Family history of malignant neoplasm of breast: Secondary | ICD-10-CM | POA: Insufficient documentation

## 2019-12-01 DIAGNOSIS — Z8249 Family history of ischemic heart disease and other diseases of the circulatory system: Secondary | ICD-10-CM | POA: Diagnosis not present

## 2019-12-01 DIAGNOSIS — Z800 Family history of malignant neoplasm of digestive organs: Secondary | ICD-10-CM | POA: Diagnosis not present

## 2019-12-01 DIAGNOSIS — Z1211 Encounter for screening for malignant neoplasm of colon: Secondary | ICD-10-CM | POA: Diagnosis present

## 2019-12-01 DIAGNOSIS — Z823 Family history of stroke: Secondary | ICD-10-CM | POA: Diagnosis not present

## 2019-12-01 DIAGNOSIS — K648 Other hemorrhoids: Secondary | ICD-10-CM | POA: Diagnosis not present

## 2019-12-01 DIAGNOSIS — Z791 Long term (current) use of non-steroidal anti-inflammatories (NSAID): Secondary | ICD-10-CM | POA: Insufficient documentation

## 2019-12-01 DIAGNOSIS — I10 Essential (primary) hypertension: Secondary | ICD-10-CM | POA: Diagnosis not present

## 2019-12-01 DIAGNOSIS — Z833 Family history of diabetes mellitus: Secondary | ICD-10-CM | POA: Insufficient documentation

## 2019-12-01 DIAGNOSIS — K635 Polyp of colon: Secondary | ICD-10-CM | POA: Diagnosis not present

## 2019-12-01 DIAGNOSIS — K5730 Diverticulosis of large intestine without perforation or abscess without bleeding: Secondary | ICD-10-CM | POA: Insufficient documentation

## 2019-12-01 HISTORY — PX: COLONOSCOPY: SHX5424

## 2019-12-01 HISTORY — PX: POLYPECTOMY: SHX5525

## 2019-12-01 SURGERY — COLONOSCOPY
Anesthesia: Moderate Sedation

## 2019-12-01 MED ORDER — SODIUM CHLORIDE 0.9 % IV SOLN: INTRAVENOUS | Status: DC

## 2019-12-01 MED ORDER — MEPERIDINE HCL 100 MG/ML IJ SOLN
INTRAMUSCULAR | Status: AC
  Filled 2019-12-01: qty 2

## 2019-12-01 MED ORDER — MIDAZOLAM HCL 5 MG/5ML IJ SOLN
INTRAMUSCULAR | Status: DC | PRN
  Administered 2019-12-01 (×2): 2 mg via INTRAVENOUS
  Administered 2019-12-01: 1 mg via INTRAVENOUS

## 2019-12-01 MED ORDER — MIDAZOLAM HCL 5 MG/5ML IJ SOLN
INTRAMUSCULAR | Status: AC
  Filled 2019-12-01: qty 10

## 2019-12-01 MED ORDER — MEPERIDINE HCL 100 MG/ML IJ SOLN
INTRAMUSCULAR | Status: DC | PRN
  Administered 2019-12-01 (×2): 25 mg

## 2019-12-01 NOTE — Op Note (Signed)
[facility] Patient Name: Caitlyn Patterson Procedure Date: 12/01/2019 11:30 AM MRN: [id number] Date of Birth: June 11, 1961 Attending MD: Barney Drain MD, MD CSN: [id number] Age: 59 Admit Type: Outpatient Procedure:                Colonoscopy WITH COLD SNARE POLYPECTOMY Indications:              Screening for colorectal malignant neoplasm Providers:                Barney Drain MD, MD, Janeece Riggers, RN, Randa Spike, Technician Referring MD:             Norwood Levo. Simpson MD, MD Medicines:                Meperidine 50 mg IV, Midazolam 5 mg IV Complications:            No immediate complications. Estimated Blood Loss:     Estimated blood loss was minimal. Procedure:                Pre-Anesthesia Assessment:                           - Prior to the procedure, a History and Physical                            was performed, and patient medications and                            allergies were reviewed. The patient's tolerance of                            previous anesthesia was also reviewed. The risks                            and benefits of the procedure and the sedation                            options and risks were discussed with the patient.                            All questions were answered, and informed consent                            was obtained. Prior Anticoagulants: The patient has                            taken no previous anticoagulant or antiplatelet                            agents. ASA Grade Assessment: II - A patient with                            mild systemic disease. After reviewing the risks  and benefits, the patient was deemed in                            satisfactory condition to undergo the procedure.                            After obtaining informed consent, the colonoscope                            was passed under direct vision. Throughout the                            procedure, the  patient's blood pressure, pulse, and                            oxygen saturations were monitored continuously. The                            [id number]-[id number]) scope was introduced through                            the anus and advanced to the the cecum, identified                            by appendiceal orifice and ileocecal valve. The                            colonoscopy was somewhat difficult due to a                            tortuous colon. Successful completion of the                            procedure was aided by straightening and shortening                            the scope to obtain bowel loop reduction and                            COLOWRAP. The patient tolerated the procedure well.                            The quality of the bowel preparation was good. The                            ileocecal valve, appendiceal orifice, and rectum                            were photographed. Scope In: 11:58:00 AM Scope Out: 12:16:28 PM Scope Withdrawal Time: 0 hours 13 minutes 46 seconds  Total Procedure Duration: 0 hours 18 minutes 28 seconds  Findings:      A 4 mm polyp was found in the mid transverse colon. The polyp was  sessile. The polyp was removed with a cold snare. Resection and       retrieval were complete.      Multiple small and large-mouthed diverticula were found in the entire       colon.      Internal hemorrhoids were found. The hemorrhoids were small. Impression:               - One 4 mm polyp in the mid transverse colon,                            removed with a cold snare. Resected and retrieved.                           - MILD Diverticulosis in the entire examined colon.                           - SMALL Internal hemorrhoids. Moderate Sedation:      Moderate (conscious) sedation was administered by the endoscopy nurse       and supervised by the endoscopist. The following parameters were       monitored: oxygen saturation, heart rate, blood  pressure, and response       to care. Total physician intraservice time was 31 minutes. Recommendation:           - Patient has a contact number available for                            emergencies. The signs and symptoms of potential                            delayed complications were discussed with the                            patient. Return to normal activities tomorrow.                            Written discharge instructions were provided to the                            patient.                           - High fiber diet.                           - Continue present medications.                           - Await pathology results.                           - Repeat colonoscopy date to be determined after                            pending pathology results are reviewed for  surveillance. Procedure Code(s):        --- Professional ---                           [phone], Colonoscopy, flexible; with removal of                            tumor(s), polyp(s), or other lesion(s) by snare                            technique                           99153, Moderate sedation; each additional 15                            minutes intraservice time                           G0500, Moderate sedation services provided by the                            same physician or other qualified health care                            professional performing a gastrointestinal                            endoscopic service that sedation supports,                            requiring the presence of an independent trained                            observer to assist in the monitoring of the                            patient's level of consciousness and physiological                            status; initial 15 minutes of intra-service time;                            patient age 47 years or older (additional time may                            be reported with [phone], as  appropriate) Diagnosis Code(s):        --- Professional ---                           Z12.11, Encounter for screening for malignant                            neoplasm of colon  K63.5, Polyp of colon                           K64.8, Other hemorrhoids                           K57.30, Diverticulosis of large intestine without                            perforation or abscess without bleeding CPT copyright 2019 American Medical Association. All rights reserved. The codes documented in this report are preliminary and upon coder review may  be revised to meet current compliance requirements. Barney Drain, MD Barney Drain MD, MD 12/01/2019 12:41:07 PM This report has been signed electronically. Number of Addenda: 0

## 2019-12-01 NOTE — H&P (Signed)
Primary Care Physician:  Fayrene Helper, MD Primary Gastroenterologist:  Dr. Oneida Alar  Pre-Procedure History & Physical: HPI:  Caitlyn Patterson is a 59 y.o. female here for COLON CANCER SCREENING.  Past Medical History:  Diagnosis Date  . BV (bacterial vaginosis)   . Fibroid   . Hematuria 09/20/2015  . Hypertension   . Menopause 07/15/2014  . Pain with urination 09/20/2015  . Rectocele 04/20/2013  . Vaginal dryness 07/15/2014  . Yeast vaginitis 08/06/2013    Past Surgical History:  Procedure Laterality Date  . COLONOSCOPY  2010   Dr. Oneida Alar: normal, small internal hemorrhoids  . NO PAST SURGERIES    . varicose veins      Prior to Admission medications   Medication Sig Start Date End Date Taking? Authorizing Provider  acetaminophen (TYLENOL) 500 MG tablet Take 1,000 mg by mouth every 6 (six) hours as needed (pain.).    Yes [provider]  amLODipine (NORVASC) 2.5 MG tablet TAKE 1 TABLET BY MOUTH  DAILY Patient taking differently: Take 2.5 mg by mouth daily in the afternoon.  11/22/19  Yes Fayrene Helper, MD  Cholecalciferol (VITAMIN D3) 50 MCG (2000 UT) TABS Take 2,000 Units by mouth daily in the afternoon.   Yes [provider]    Allergies as of 08/31/2019 - Review Complete 08/18/2019  Allergen Reaction Noted  . Amoxicillin  04/18/2009  . Ibuprofen  03/13/2009  . Bactrim [sulfamethoxazole-trimethoprim] Rash 08/07/2018    Family History  Problem Relation Age of Onset  . Cancer Maternal Grandmother        colon   . Stroke Mother   . Hypertension Mother   . Cancer Maternal Aunt        breast  . Pneumonia Father   . Hypertension Brother   . Hypertension Sister   . Varicose Veins Sister   . Diabetes Paternal Aunt   . Colon polyps Neg Hx     Social History   Socioeconomic History  . Marital status: Married    Spouse name: Not on file  . Number of children: Not on file  . Years of education: Not on file  . Highest education level: Not on  file  Occupational History  . Not on file  Tobacco Use  . Smoking status: Never Smoker  . Smokeless tobacco: Never Used  Substance and Sexual Activity  . Alcohol use: Yes    Alcohol/week: 2.0 - 3.0 standard drinks    Types: 1 - 2 Glasses of wine, 1 Shots of liquor per week    Comment: occ  . Drug use: No  . Sexual activity: Yes    Birth control/protection: Post-menopausal  Other Topics Concern  . Not on file  Social History Narrative  . Not on file   Social Determinants of Health   Financial Resource Strain:   . Difficulty of Paying Living Expenses: Not on file  Food Insecurity:   . Worried About Charity fundraiser in the Last Year: Not on file  . Ran Out of Food in the Last Year: Not on file  Transportation Needs:   . Lack of Transportation (Medical): Not on file  . Lack of Transportation (Non-Medical): Not on file  Physical Activity:   . Days of Exercise per Week: Not on file  . Minutes of Exercise per Session: Not on file  Stress:   . Feeling of Stress : Not on file  Social Connections:   . Frequency of Communication with Friends and Family:  Not on file  . Frequency of Social Gatherings with Friends and Family: Not on file  . Attends Religious Services: Not on file  . Active Member of Clubs or Organizations: Not on file  . Attends Archivist Meetings: Not on file  . Marital Status: Not on file  Intimate Partner Violence:   . Fear of Current or Ex-Partner: Not on file  . Emotionally Abused: Not on file  . Physically Abused: Not on file  . Sexually Abused: Not on file    Review of Systems: See HPI, otherwise negative ROS   Physical Exam: BP 127/79   Pulse 61   Temp 99 F (37.2 C) (Oral)   Resp 19   Ht 5\' 5"  (1.651 m)   Wt 86.2 kg   LMP 07/23/2013   SpO2 99%   BMI 31.62 kg/m  General:   Alert,  pleasant and cooperative in NAD Head:  Normocephalic and atraumatic. Neck:  Supple; Lungs:  Clear throughout to auscultation.    Heart:  Regular  rate and rhythm. Abdomen:  Soft, nontender and nondistended. Normal bowel sounds, without guarding, and without rebound.   Neurologic:  Alert and  oriented x4;  grossly normal neurologically.  Impression/Plan:    SCREENING  Plan:  1. TCS TODAY DISCUSSED PROCEDURE, BENEFITS, & RISKS: < 1% chance of medication reaction, bleeding, perforation, ASPIRATION, or rupture of spleen/liver requiring surgery to fix it and missed polyps < 1 cm 10-20% of the time.

## 2019-12-01 NOTE — Discharge Instructions (Signed)
You have small internal hemorrhoids and diverticulosis IN YOUR LEFT AND RIGHT COLON. YOU HAD ONE SMALL POLYP REMOVED.    DRINK WATER TO KEEP YOUR URINE LIGHT YELLOW.  CONTINUE YOUR WEIGHT LOSS EFFORTS. YOUR BODY MASS INDEX IS OVER 30 WHICH MEANS YOU ARE OBESE. OBESITY IS ASSOCIATED WITH AN INCREASE FOR ALL CANCERS, INCLUDING ESOPHAGEAL AND COLON CANCER. I RECOMMEND YOU READ AND FOLLOW RECOMMENDATIONS BY DR. MARK HYMAN, "10-DAY DETOX DIET".  FOLLOW A HIGH FIBER DIET. AVOID ITEMS THAT CAUSE BLOATING. See info below.   USE PREPARATION H FOUR TIMES  A DAY IF NEEDED TO RELIEVE RECTAL PAIN/PRESSURE/BLEEDING.  YOUR next colonoscopy WILL BE SCHEDULED BASED ON YOUR FINAL PATHOLOGY REPORT.   Colonoscopy Care After Read the instructions outlined below and refer to this sheet in the next week. These discharge instructions provide you with general information on caring for yourself after you leave the hospital. While your treatment has been planned according to the most current medical practices available, unavoidable complications occasionally occur. If you have any problems or questions after discharge, call DR. Chace Klippel, [phone].  ACTIVITY  You may resume your regular activity, but move at a slower pace for the next 24 hours.   Take frequent rest periods for the next 24 hours.   Walking will help get rid of the air and reduce the bloated feeling in your belly (abdomen).   No driving for 24 hours (because of the medicine (anesthesia) used during the test).   You may shower.   Do not sign any important legal documents or operate any machinery for 24 hours (because of the anesthesia used during the test).    NUTRITION  Drink plenty of fluids.   You may resume your normal diet as instructed by your doctor.   Begin with a light meal and progress to your normal diet. Heavy or fried foods are harder to digest and may make you feel sick to your stomach (nauseated).   Avoid alcoholic  beverages for 24 hours or as instructed.    MEDICATIONS  You may resume your normal medications.   WHAT YOU CAN EXPECT TODAY  Some feelings of bloating in the abdomen.   Passage of more gas than usual.   Spotting of blood in your stool or on the toilet paper  .  IF YOU HAD POLYPS REMOVED DURING THE COLONOSCOPY:  Eat a soft diet IF YOU HAVE NAUSEA, BLOATING, ABDOMINAL PAIN, OR VOMITING.    FINDING OUT THE RESULTS OF YOUR TEST Not all test results are available during your visit. DR. Oneida Alar WILL CALL YOU WITHIN 14 DAYS OF YOUR PROCEDUE WITH YOUR RESULTS. Do not assume everything is normal if you have not heard from DR. Jullien Granquist, CALL HER OFFICE AT [phone].  SEEK IMMEDIATE MEDICAL ATTENTION AND CALL THE OFFICE: [phone] IF:  You have more than a spotting of blood in your stool.   Your belly is swollen (abdominal distention).   You are nauseated or vomiting.   You have a temperature over 101F.   You have abdominal pain or discomfort that is severe or gets worse throughout the day.  High-Fiber Diet A high-fiber diet changes your normal diet to include more whole grains, legumes, fruits, and vegetables. Changes in the diet involve replacing refined carbohydrates with unrefined foods. The calorie level of the diet is essentially unchanged. The Dietary Reference Intake (recommended amount) for adult males is 38 grams per day. For adult females, it is 25 grams per day. Pregnant and lactating women should  consume 28 grams of fiber per day. Fiber is the intact part of a plant that is not broken down during digestion. Functional fiber is fiber that has been isolated from the plant to provide a beneficial effect in the body.  PURPOSE Increase stool bulk.  Ease and regulate bowel movements.  Lower cholesterol.  REDUCE RISK OF COLON CANCER  INDICATIONS THAT YOU NEED MORE FIBER Constipation and hemorrhoids.  Uncomplicated diverticulosis (intestine condition) and irritable bowel  syndrome.  Weight management.  As a protective measure against hardening of the arteries (atherosclerosis), diabetes, and cancer.   GUIDELINES FOR INCREASING FIBER IN THE DIET Start adding fiber to the diet slowly. A gradual increase of about 5 more grams (2 servings of most fruits or vegetables) per day is best. Too rapid an increase in fiber may result in constipation, flatulence, and bloating.  Drink enough water and fluids to keep your urine clear or pale yellow. Water, juice, or caffeine-free drinks are recommended. Not drinking enough fluid may cause constipation.  Eat a variety of high-fiber foods rather than one type of fiber.  Try to increase your intake of fiber through using high-fiber foods rather than fiber pills or supplements that contain small amounts of fiber.  The goal is to change the types of food eaten. Do not supplement your present diet with high-fiber foods, but replace foods in your present diet.    Polyps, Colon  A polyp is extra tissue that grows inside your body. Colon polyps grow in the large intestine. The large intestine, also called the colon, is part of your digestive system. It is a long, hollow tube at the end of your digestive tract where your body makes and stores stool. Most polyps are not dangerous. They are benign. This means they are not cancerous. But over time, some types of polyps can turn into cancer. Polyps that are smaller than a pea are usually not harmful. But larger polyps could someday become or may already be cancerous. To be safe, doctors remove all polyps and test them.   PREVENTION There is not one sure way to prevent polyps. You might be able to lower your risk of getting them if you:  Eat more fruits and vegetables and less fatty food.   Do not smoke.   Avoid alcohol.   Exercise every day.   Lose weight if you are overweight.   Eating more calcium and folate can also lower your risk of getting polyps. Some foods that are rich in  calcium are milk, cheese, and broccoli. Some foods that are rich in folate are chickpeas, kidney beans, and spinach.    Diverticulosis Diverticulosis is a common condition that develops when small pouches (diverticula) form in the wall of the colon. The risk of diverticulosis increases with age. It happens more often in people who eat a low-fiber diet. Most individuals with diverticulosis have no symptoms. Those individuals with symptoms usually experience belly (abdominal) pain, constipation, or loose stools (diarrhea).  HOME CARE INSTRUCTIONS  Increase the amount of fiber in your diet as directed by your caregiver or dietician. This may reduce symptoms of diverticulosis.   Drink at least 6 to 8 glasses of water each day to prevent constipation.   Try not to strain when you have a bowel movement.   Avoiding nuts and seeds to prevent complications is NOT NECESSARY.   FOODS HAVING HIGH FIBER CONTENT INCLUDE:  Fruits. Apple, peach, pear, tangerine, raisins, prunes.   Vegetables. Brussels sprouts, asparagus, broccoli, cabbage,  carrot, cauliflower, romaine lettuce, spinach, summer squash, tomato, winter squash, zucchini.   Starchy Vegetables. Baked beans, kidney beans, lima beans, split peas, lentils, potatoes (with skin).    SEEK IMMEDIATE MEDICAL CARE IF:  You develop increasing pain or severe bloating.   You have an oral temperature above 101F.   You develop vomiting or bowel movements that are bloody or black.

## 2019-12-02 LAB — SURGICAL PATHOLOGY

## 2020-01-20 ENCOUNTER — Other Ambulatory Visit (HOSPITAL_COMMUNITY)
Admission: RE | Admit: 2020-01-20 | Discharge: 2020-01-20 | Disposition: A | Discharge status: Home / Self Care | Payer: 59 | Source: Ambulatory Visit | Attending: Adult Health | Admitting: Adult Health

## 2020-01-20 ENCOUNTER — Other Ambulatory Visit: Payer: Self-pay

## 2020-01-20 ENCOUNTER — Encounter: Payer: Self-pay | Admitting: Adult Health

## 2020-01-20 ENCOUNTER — Ambulatory Visit (INDEPENDENT_AMBULATORY_CARE_PROVIDER_SITE_OTHER): Payer: 59 | Admitting: Adult Health

## 2020-01-20 VITALS — BP 117/72 | HR 55 | Ht 67.75 in | Wt 198.0 lb

## 2020-01-20 DIAGNOSIS — Z01419 Encounter for gynecological examination (general) (routine) without abnormal findings: Secondary | ICD-10-CM

## 2020-01-20 DIAGNOSIS — Z1211 Encounter for screening for malignant neoplasm of colon: Secondary | ICD-10-CM | POA: Insufficient documentation

## 2020-01-20 DIAGNOSIS — Z1212 Encounter for screening for malignant neoplasm of rectum: Secondary | ICD-10-CM | POA: Diagnosis not present

## 2020-01-20 LAB — HEMOCCULT GUIAC POC 1CARD (OFFICE): Fecal Occult Blood, POC: NEGATIVE

## 2020-01-20 NOTE — Progress Notes (Signed)
Patient ID: Caitlyn Patterson, female   DOB: 12/25/1960, 59 y.o.   MRN: [id number] History of Present Illness: Ortensia is a 59 year old black female, married, PM in for a well woman gyn exam and pap. PCP is Dr Moshe Cipro.    Current Medications, Allergies, Past Medical History, Past Surgical History, Family History and Social History were reviewed in [employer] record.     Review of Systems: Patient denies any headaches, hearing loss, fatigue, blurred vision, shortness of breath, chest pain, abdominal pain, problems with bowel movements, urination, or intercourse. No joint pain or mood swings.No vaginal bleeding.     Physical Exam:BP 117/72 (BP Location: Left Arm, Patient Position: Sitting, Cuff Size: Normal)   Pulse (!) 55   Ht 5' 7.75" (1.721 m)   Wt 198 lb (89.8 kg)   LMP 07/23/2013   BMI 30.33 kg/m  General:  Well developed, well nourished, no acute distress Skin:  Warm and dry Neck:  Midline trachea, normal thyroid, good ROM, no lymphadenopathy Lungs; Clear to auscultation bilaterally Breast:  No dominant palpable mass, retraction, or nipple discharge Cardiovascular: Regular rate and rhythm Abdomen:  Soft, non tender, no hepatosplenomegaly Pelvic:  External genitalia is normal in appearance, no lesions.  The vagina is normal in appearance. Urethra has no lesions or masses. The cervix is smooth, pap with high risk HPV 16/18 genotyping performed.  Uterus is felt to be normal size, shape, and contour.  No adnexal masses or tenderness noted.Bladder is non tender, no masses felt. Rectal: Good sphincter tone, no polyps, or hemorrhoids felt.  Hemoccult negative. Extremities/musculoskeletal:  No swelling or varicosities noted, no clubbing or cyanosis Psych:  No mood changes, alert and cooperative,seems happy Fall risk is low PHQ 2 score is 0. Examination chaperoned by Estill Bamberg Rash LPN  Impression and Plan: 1. Well woman exam with routine gynecological exam   2.  Encounter for gynecological examination with Papanicolaou smear of cervix Pap sent Pap in 3 if normal Physical with PCP Mammogram yearly Labs with PCP   3. Screening for colorectal cancer Had colonoscopy 11/2019, so follow up per GI

## 2020-01-25 LAB — CYTOLOGY - PAP
Comment: NEGATIVE
Diagnosis: UNDETERMINED — AB
High risk HPV: NEGATIVE

## 2020-01-29 ENCOUNTER — Ambulatory Visit: Payer: 59 | Attending: Internal Medicine

## 2020-01-29 DIAGNOSIS — Z23 Encounter for immunization: Secondary | ICD-10-CM

## 2020-01-29 NOTE — Progress Notes (Signed)
   Covid-19 Vaccination Clinic  Name:  Caitlyn Patterson    MRN: [id number] DOB: June 22, 1961  01/29/2020  Caitlyn Patterson was observed post Covid-19 immunization for 15 minutes without incident. She was provided with Vaccine Information Sheet and instruction to access the V-Safe system.   Caitlyn Patterson was instructed to call 911 with any severe reactions post vaccine: Marland Kitchen Difficulty breathing  . Swelling of face and throat  . A fast heartbeat  . A bad rash all over body  . Dizziness and weakness   Immunizations Administered    Name Date Dose VIS Date Route   Moderna COVID-19 Vaccine 01/29/2020  2:46 PM 0.5 mL 10/19/2019 Intramuscular   Manufacturer: Moderna   Lot: [id number]   [id number]

## 2020-03-01 ENCOUNTER — Ambulatory Visit: Payer: 59 | Attending: Internal Medicine

## 2020-03-01 DIAGNOSIS — Z23 Encounter for immunization: Secondary | ICD-10-CM

## 2020-03-01 NOTE — Progress Notes (Signed)
   Covid-19 Vaccination Clinic  Name:  Caitlyn Patterson    MRN: [id number] DOB: Mar 24, 1961  03/01/2020  [name] was observed post Covid-19 immunization for 15 minutes without incident. She was provided with Vaccine Information Sheet and instruction to access the V-Safe system.   [name] was instructed to call 911 with any severe reactions post vaccine: Marland Kitchen Difficulty breathing  . Swelling of face and throat  . A fast heartbeat  . A bad rash all over body  . Dizziness and weakness   Immunizations Administered    Name Date Dose VIS Date Route   Moderna COVID-19 Vaccine 03/01/2020 12:52 PM 0.5 mL 10/19/2019 Intramuscular   Manufacturer: Moderna   Lot: [id number]   [place][id number]

## 2020-03-09 ENCOUNTER — Other Ambulatory Visit: Payer: Self-pay | Admitting: Family Medicine

## 2020-04-24 ENCOUNTER — Other Ambulatory Visit: Payer: Self-pay

## 2020-04-24 ENCOUNTER — Emergency Department (HOSPITAL_COMMUNITY)
Admission: EM | Admit: 2020-04-24 | Discharge: 2020-04-24 | Disposition: A | Discharge status: Home / Self Care | Payer: 59 | Attending: Emergency Medicine | Admitting: Emergency Medicine

## 2020-04-24 ENCOUNTER — Emergency Department (HOSPITAL_COMMUNITY): Payer: 59

## 2020-04-24 ENCOUNTER — Encounter (HOSPITAL_COMMUNITY): Payer: Self-pay

## 2020-04-24 DIAGNOSIS — Z880 Allergy status to penicillin: Secondary | ICD-10-CM | POA: Insufficient documentation

## 2020-04-24 DIAGNOSIS — R1032 Left lower quadrant pain: Secondary | ICD-10-CM | POA: Diagnosis not present

## 2020-04-24 DIAGNOSIS — K5792 Diverticulitis of intestine, part unspecified, without perforation or abscess without bleeding: Secondary | ICD-10-CM

## 2020-04-24 DIAGNOSIS — Z886 Allergy status to analgesic agent status: Secondary | ICD-10-CM | POA: Insufficient documentation

## 2020-04-24 DIAGNOSIS — K5732 Diverticulitis of large intestine without perforation or abscess without bleeding: Secondary | ICD-10-CM | POA: Diagnosis not present

## 2020-04-24 DIAGNOSIS — Z882 Allergy status to sulfonamides status: Secondary | ICD-10-CM | POA: Insufficient documentation

## 2020-04-24 DIAGNOSIS — Z79899 Other long term (current) drug therapy: Secondary | ICD-10-CM | POA: Diagnosis not present

## 2020-04-24 DIAGNOSIS — I10 Essential (primary) hypertension: Secondary | ICD-10-CM | POA: Insufficient documentation

## 2020-04-24 LAB — COMPREHENSIVE METABOLIC PANEL
ALT: 17 U/L (ref 0–44)
AST: 18 U/L (ref 15–41)
Albumin: 3.7 g/dL (ref 3.5–5.0)
Alkaline Phosphatase: 52 U/L (ref 38–126)
Anion gap: 7 (ref 5–15)
BUN: 16 mg/dL (ref 6–20)
CO2: 26 mmol/L (ref 22–32)
Calcium: 8.6 mg/dL — ABNORMAL LOW (ref 8.9–10.3)
Chloride: 107 mmol/L (ref 98–111)
Creatinine, Ser: 0.62 mg/dL (ref 0.44–1.00)
GFR calc Af Amer: >60 mL/min (ref >60)
GFR calc non Af Amer: >60 mL/min (ref >60)
Glucose, Bld: 101 mg/dL — ABNORMAL HIGH (ref 70–99)
Potassium: 3.9 mmol/L (ref 3.5–5.1)
Sodium: 140 mmol/L (ref 135–145)
Total Bilirubin: 0.4 mg/dL (ref 0.3–1.2)
Total Protein: 7.1 g/dL (ref 6.5–8.1)

## 2020-04-24 LAB — CBC WITH DIFFERENTIAL/PLATELET
Abs Immature Granulocytes: 0.01 10*3/uL (ref 0.00–0.07)
Basophils Absolute: 0 10*3/uL (ref 0.0–0.1)
Basophils Relative: 1 %
Eosinophils Absolute: 0.1 10*3/uL (ref 0.0–0.5)
Eosinophils Relative: 1 %
HCT: 36.7 % (ref 36.0–46.0)
Hemoglobin: 12.2 g/dL (ref 12.0–15.0)
Immature Granulocytes: 0 %
Lymphocytes Relative: 28 %
Lymphs Abs: 2 10*3/uL (ref 0.7–4.0)
MCH: 33.1 pg (ref 26.0–34.0)
MCHC: 33.2 g/dL (ref 30.0–36.0)
MCV: 99.5 fL (ref 80.0–100.0)
Monocytes Absolute: 0.6 10*3/uL (ref 0.1–1.0)
Monocytes Relative: 8 %
Neutro Abs: 4.5 10*3/uL (ref 1.7–7.7)
Neutrophils Relative %: 62 %
Platelets: 149 10*3/uL — ABNORMAL LOW (ref 150–400)
RBC: 3.69 MIL/uL — ABNORMAL LOW (ref 3.87–5.11)
RDW: 12.1 % (ref 11.5–15.5)
WBC: 7.3 10*3/uL (ref 4.0–10.5)
nRBC: 0 % (ref 0.0–0.2)

## 2020-04-24 LAB — URINALYSIS, ROUTINE W REFLEX MICROSCOPIC
Bilirubin Urine: NEGATIVE
Glucose, UA: NEGATIVE mg/dL
Hgb urine dipstick: NEGATIVE
Ketones, ur: NEGATIVE mg/dL
Leukocytes,Ua: NEGATIVE
Nitrite: NEGATIVE
Protein, ur: NEGATIVE mg/dL
Specific Gravity, Urine: 1.024 (ref 1.005–1.030)
pH: 5 (ref 5.0–8.0)

## 2020-04-24 LAB — LIPASE, BLOOD: Lipase: 15 U/L (ref 11–51)

## 2020-04-24 MED ORDER — IOHEXOL 300 MG/ML  SOLN
100.0000 mL | Freq: Once | INTRAMUSCULAR | Status: AC | PRN
  Administered 2020-04-24: 100 mL via INTRAVENOUS

## 2020-04-24 MED ORDER — SODIUM CHLORIDE 0.9 % IV SOLN: INTRAVENOUS | Status: DC

## 2020-04-24 MED ORDER — PREDNISONE 50 MG PO TABS: 60.0000 mg | ORAL_TABLET | Freq: Once | ORAL | Status: DC

## 2020-04-24 MED ORDER — CIPROFLOXACIN HCL 500 MG PO TABS: 500.0000 mg | ORAL_TABLET | Freq: Two times a day (BID) | ORAL | 0 refills | Status: DC

## 2020-04-24 MED ORDER — METRONIDAZOLE 500 MG PO TABS: 500.0000 mg | ORAL_TABLET | Freq: Three times a day (TID) | ORAL | 0 refills | Status: AC

## 2020-04-24 MED ORDER — TRAMADOL HCL 50 MG PO TABS: 50.0000 mg | ORAL_TABLET | Freq: Four times a day (QID) | ORAL | 0 refills | Status: DC | PRN

## 2020-04-24 NOTE — ED Provider Notes (Signed)
[facility] EMERGENCY DEPARTMENT Provider Note   CSN: [id number] Arrival date & time: 04/24/20  7829     History Chief Complaint  Patient presents with  . Flank Pain    Caitlyn Patterson is a 59 y.o. female.  Patient with complaint of left flank pain that now radiates to the left lower quadrant.  Started on Saturday.  Pain is intermittent no nausea or vomiting.  No urinary tract symptoms.  No history of similar symptoms in the past.  No history of kidney stones.  No fever.  No upper respiratory symptoms.        Past Medical History:  Diagnosis Date  . BV (bacterial vaginosis)   . Fibroid   . Hematuria 09/20/2015  . Hypertension   . Menopause 07/15/2014  . Pain with urination 09/20/2015  . Rectocele 04/20/2013  . Vaginal dryness 07/15/2014  . Yeast vaginitis 08/06/2013    Patient Active Problem List   Diagnosis Date Noted  . Screening for colorectal cancer 01/20/2020  . Encounter for gynecological examination with Papanicolaou smear of cervix 01/20/2020  . Well woman exam with routine gynecological exam 01/20/2020  . Educated about COVID-19 virus infection 04/12/2019  . Heel pain, chronic, right 04/08/2019  . Liver cyst 08/13/2018  . Constipation 07/10/2017  . Hyperlipidemia LDL goal <100 06/29/2017  . Essential hypertension 11/14/2016  . Obesity (BMI 30.0-34.9) 10/02/2014  . Hot flashes, menopausal 10/24/2013  . Fibroids 04/20/2013  . Rectocele 04/20/2013  . Vitamin D deficiency 11/27/2010    Past Surgical History:  Procedure Laterality Date  . COLONOSCOPY  2010   Dr. Oneida Alar: normal, small internal hemorrhoids  . COLONOSCOPY N/A 12/01/2019   Procedure: COLONOSCOPY;  Surgeon: Danie Binder, MD;  Location: [facility];  Service: Endoscopy;  Laterality: N/A;  12:00  . NO PAST SURGERIES    . POLYPECTOMY  12/01/2019   Procedure: POLYPECTOMY;  Surgeon: Danie Binder, MD;  Location: [facility] SU[facility];  Service: Endoscopy;;  transverse colon  . varicose veins        OB History    Gravida  1   Para      Term      Preterm      AB  1   Living        SAB      TAB  1   Ectopic      Multiple      Live Births              Family History  Problem Relation Age of Onset  . Cancer Maternal Grandmother        colon   . Stroke Mother   . Hypertension Mother   . Cancer Maternal Aunt        breast  . Pneumonia Father   . Hypertension Brother   . Hypertension Sister   . Varicose Veins Sister   . Diabetes Paternal Aunt   . Colon polyps Neg Hx     Social History   Tobacco Use  . Smoking status: Never Smoker  . Smokeless tobacco: Never Used  Substance Use Topics  . Alcohol use: Yes    Alcohol/week: 2.0 - 3.0 standard drinks    Types: 1 - 2 Glasses of wine, 1 Shots of liquor per week    Comment: occ  . Drug use: No    Home Medications Prior to Admission medications   Medication Sig Start Date End Date Taking? Authorizing Provider  acetaminophen (TYLENOL) 500 MG  tablet Take 1,000 mg by mouth every 6 (six) hours as needed (pain.).     [provider]  amLODipine (NORVASC) 2.5 MG tablet TAKE 1 TABLET BY MOUTH  DAILY 03/09/20   Fayrene Helper, MD  Cholecalciferol (VITAMIN D3) 50 MCG (2000 UT) TABS Take 2,000 Units by mouth daily in the afternoon.    [provider]  ciprofloxacin (CIPRO) 500 MG tablet Take 1 tablet (500 mg total) by mouth 2 (two) times daily. 04/24/20   Fredia Sorrow, MD  metroNIDAZOLE (FLAGYL) 500 MG tablet Take 1 tablet (500 mg total) by mouth 3 (three) times daily for 7 days. 04/24/20 05/01/20  Fredia Sorrow, MD  traMADol (ULTRAM) 50 MG tablet Take 1 tablet (50 mg total) by mouth every 6 (six) hours as needed. 04/24/20   Fredia Sorrow, MD    Allergies    Amoxicillin, Bactrim [sulfamethoxazole-trimethoprim], and Ibuprofen  Review of Systems   Review of Systems  Constitutional: Negative for chills and fever.  HENT: Negative for congestion, rhinorrhea and sore throat.   Eyes:  Negative for visual disturbance.  Respiratory: Negative for cough and shortness of breath.   Cardiovascular: Negative for chest pain and leg swelling.  Gastrointestinal: Positive for abdominal pain. Negative for diarrhea, nausea and vomiting.  Genitourinary: Positive for flank pain. Negative for dysuria.  Musculoskeletal: Negative for back pain and neck pain.  Skin: Negative for rash.  Neurological: Negative for dizziness, light-headedness and headaches.  Hematological: Does not bruise/bleed easily.  Psychiatric/Behavioral: Negative for confusion.    Physical Exam Updated Vital Signs BP 122/79   Pulse 65   Temp 97.7 F (36.5 C) (Oral)   Resp 16   Ht 1.651 m (5\' 5" )   Wt 88.5 kg   LMP 07/23/2013   SpO2 100%   BMI 32.45 kg/m   Physical Exam Vitals and nursing note reviewed.  Constitutional:      General: She is not in acute distress.    Appearance: Normal appearance. She is well-developed.  HENT:     Head: Normocephalic and atraumatic.  Eyes:     Extraocular Movements: Extraocular movements intact.     Conjunctiva/sclera: Conjunctivae normal.     Pupils: Pupils are equal, round, and reactive to light.  Cardiovascular:     Rate and Rhythm: Normal rate and regular rhythm.     Heart sounds: No murmur.  Pulmonary:     Effort: Pulmonary effort is normal. No respiratory distress.     Breath sounds: Normal breath sounds.  Abdominal:     Palpations: Abdomen is soft.     Tenderness: There is abdominal tenderness. There is no guarding.     Comments: Tenderness to palpation left lower quadrant.  No guarding.  Musculoskeletal:        General: Normal range of motion.     Cervical back: Normal range of motion and neck supple.  Skin:    General: Skin is warm and dry.     Capillary Refill: Capillary refill takes less than 2 seconds.  Neurological:     General: No focal deficit present.     Mental Status: She is alert and oriented to person, place, and time.     ED Results /  Procedures / Treatments   Labs (all labs ordered are listed, but only abnormal results are displayed) Labs Reviewed  COMPREHENSIVE METABOLIC PANEL - Abnormal; Notable for the following components:      Result Value   Glucose, Bld 101 (*)    Calcium 8.6 (*)  All other components within normal limits  CBC WITH DIFFERENTIAL/PLATELET - Abnormal; Notable for the following components:   RBC 3.69 (*)    Platelets 149 (*)    All other components within normal limits  URINALYSIS, ROUTINE W REFLEX MICROSCOPIC  LIPASE, BLOOD    EKG None  Radiology CT Abdomen Pelvis W Contrast  Result Date: 04/24/2020 CLINICAL DATA:  Intermittent left flank pain and left abdominal pain. EXAM: CT ABDOMEN AND PELVIS WITH CONTRAST TECHNIQUE: Multidetector CT imaging of the abdomen and pelvis was performed using the standard protocol following bolus administration of intravenous contrast. CONTRAST:  132mL OMNIPAQUE IOHEXOL 300 MG/ML  SOLN COMPARISON:  None. FINDINGS: Lower chest: Lung bases are clear. Heart is at the upper limits of normal in size to mildly enlarged. No pericardial or pleural effusion. There may be distal esophageal wall thickening which can be seen with gastroesophageal reflux. Hepatobiliary: Low-attenuation lesions in the liver measure up to 6.0 x 6.2 cm and are indicative of cysts. Liver and gallbladder are otherwise unremarkable. No biliary ductal dilatation. Pancreas: Negative. Spleen: Negative. Adrenals/Urinary Tract: Adrenal glands and kidneys are unremarkable. Ureters are decompressed. Bladder is grossly unremarkable. Stomach/Bowel: Stomach is decompressed. Small bowel and appendix are unremarkable. Fair amount of stool is seen in the colon. There is mild inflammatory haziness and stranding adjacent to a diverticulum off the distal descending colon (2/64). No extraluminal air or fluid. Vascular/Lymphatic: Vascular structures are unremarkable. Periportal lymph nodes are not enlarged by CT size  criteria. No pathologically enlarged lymph nodes. Reproductive: Uterus is visualized.  No adnexal mass. Other: No free fluid. Mesenteries peritoneum are otherwise unremarkable. Musculoskeletal: Mild degenerative changes in the spine. No worrisome lytic or sclerotic lesions. IMPRESSION: 1. Diverticulitis involving the distal descending colon. No complicating features. 2. Fair amount of stool in the colon is indicative of constipation. Electronically Signed   By: Lorin Picket M.D.   On: 04/24/2020 10:24    Procedures Procedures (including critical care time)  Medications Ordered in ED Medications  0.9 %  sodium chloride infusion ( Intravenous New Bag/Given 04/24/20 0840)  iohexol (OMNIPAQUE) 300 MG/ML solution 100 mL (100 mLs Intravenous Contrast Given 04/24/20 0943)    ED Course  I have reviewed the triage vital signs and the nursing notes.  Pertinent labs & imaging results that were available during my care of the patient were reviewed by me and considered in my medical decision making (see chart for details).    MDM Rules/Calculators/A&P                      Clinically tenderness with suggestive of diverticulitis.  CT scan does confirm that.  No complicating factors.  Labs are normal.  Including urinalysis.  Patient will be treated with Cipro and Flagyl follow-up with her primary care doctor.  Tramadol for pain as needed.  Patient will return for any new or worse symptoms.    Final Clinical Impression(s) / ED Diagnoses Final diagnoses:  Diverticulitis    Rx / DC Orders ED Discharge Orders         Ordered    traMADol (ULTRAM) 50 MG tablet  Every 6 hours PRN     04/24/20 1131    ciprofloxacin (CIPRO) 500 MG tablet  2 times daily     04/24/20 1131    metroNIDAZOLE (FLAGYL) 500 MG tablet  3 times daily     04/24/20 1131           Fredia Sorrow, MD 04/24/20 1134

## 2020-04-24 NOTE — Discharge Instructions (Addendum)
CT scan shows evidence of diverticulitis without complications.  Take the antibiotic Cipro and Flagyl as directed for the next 7 days.  Return for any new or worse symptoms.  Can appointment to follow-up with your primary care doctor.  You should improve over the next few days.  Take the tramadol as needed for pain.

## 2020-04-24 NOTE — ED Triage Notes (Signed)
Pt reports left flank pain that radiates to left abdomen. Pain is intermittent . denies urinary symptoms

## 2020-04-27 ENCOUNTER — Ambulatory Visit (INDEPENDENT_AMBULATORY_CARE_PROVIDER_SITE_OTHER): Payer: 59 | Admitting: Family Medicine

## 2020-04-27 ENCOUNTER — Encounter: Payer: Self-pay | Admitting: Family Medicine

## 2020-04-27 ENCOUNTER — Other Ambulatory Visit: Payer: Self-pay

## 2020-04-27 VITALS — BP 108/78 | HR 64 | Temp 97.3°F | Resp 16 | Ht 65.0 in | Wt 202.0 lb

## 2020-04-27 DIAGNOSIS — E669 Obesity, unspecified: Secondary | ICD-10-CM

## 2020-04-27 DIAGNOSIS — K5790 Diverticulosis of intestine, part unspecified, without perforation or abscess without bleeding: Secondary | ICD-10-CM

## 2020-04-27 DIAGNOSIS — I10 Essential (primary) hypertension: Secondary | ICD-10-CM

## 2020-04-27 DIAGNOSIS — E785 Hyperlipidemia, unspecified: Secondary | ICD-10-CM

## 2020-04-27 DIAGNOSIS — E559 Vitamin D deficiency, unspecified: Secondary | ICD-10-CM

## 2020-04-27 DIAGNOSIS — Z09 Encounter for follow-up examination after completed treatment for conditions other than malignant neoplasm: Secondary | ICD-10-CM

## 2020-04-27 NOTE — Patient Instructions (Addendum)
F/U in September, call if you need me before  You may continue to enjoy strawberries, fruit and nuts.  Fasting lipid, TSH and vit D for September visit Increase dietary fiber to make stool more formed, soft , regular and POSSIBLY reduce divert ula formation and complications  It is important that you exercise regularly at least 30 minutes 5 times a week. If you develop chest pain, have severe difficulty breathing, or feel very tired, stop exercising immediately and seek medical attention   Think about what you will eat, plan ahead. Choose " clean, green, fresh or frozen" over canned, processed or packaged foods which are more sugary, salty and fatty. 70 to 75% of food eaten should be vegetables and fruit. Three meals at set times with snacks allowed between meals, but they must be fruit or vegetables. Aim to eat over a 12 hour period , example 7 am to 7 pm, and STOP after  your last meal of the day. Drink water,generally about 64 ounces per day, no other drink is as healthy. Fruit juice is best enjoyed in a healthy way, by EATING the fruit. Thanks for choosing [facility], we consider it a privelige to serve you.

## 2020-04-27 NOTE — Progress Notes (Signed)
   Caitlyn Patterson     MRN: [id number]      DOB: 1961/03/30   HPI Caitlyn Patterson is here for follow up from recent ED visit with sx of acute diverticulitis, first episode, pain was a 10 at presentatiobn today a 3, no fever, chills , rectal blood , current pain is 2 to 3. Eating a regular diet and feels able to return to work tomorrow as planned through ED Was unaware she had diverticulosis throughout her colon so pt educated about this at the visit Otherwise doing well except states she needs to increase exercise and plans to eD notes , labs and imagimng studies are reviewed with pt ROS Denies recent fever or chills. Denies sinus pressure, nasal congestion, ear pain or sore throat. Denies chest congestion, productive cough or wheezing. Denies chest pains, palpitations and leg swelling    Denies dysuria, frequency, hesitancy or incontinence. Denies joint pain, swelling and limitation in mobility. Denies headaches, seizures, numbness, or tingling. Denies depression, anxiety or insomnia. Denies skin break down or rash.   PE  BP 108/78   Pulse 64   Temp (!) 97.3 F (36.3 C) (Temporal)   Resp 16   Ht 5\' 5"  (1.651 m)   Wt 202 lb (91.6 kg)   LMP 07/23/2013   SpO2 96%   BMI 33.61 kg/m   Patient alert and oriented and in no cardiopulmonary distress.  HEENT: No facial asymmetry, EOMI,     Neck supple .  Chest: Clear to auscultation bilaterally.  CVS: S1, S2 no murmurs, no S3.Regular rate.  ABD: Soft non tender.   Ext: No edema  MS: Adequate ROM spine, shoulders, hips and knees.  Skin: Intact, no ulcerations or rash noted.  Psych: Good eye contact, normal affect. Memory intact not anxious or depressed appearing.  CNS: CN 2-12 intact, power,  normal throughout.no focal deficits noted.   Assessment & Plan  Encounter for examination following treatment at hospital Patient in for follow up of recent ED visit Discharge summary, and laboratory and radiology data are reviewed,  and any questions or concerns about recent visit  are discussed. Specific issues requiring follow up are specifically addressed.    Diverticulosis Recovering well from uncomplicated acute flare. Educated re need to complete antibiotic course Educated about the disease and possible complications to be on the alert for  Obesity (BMI 30.0-34.9)  Patient re-educated about  the importance of commitment to a  minimum of 150 minutes of exercise per week as able.  The importance of healthy food choices with portion control discussed, as well as eating regularly and within a 12 hour window most days. The need to choose "clean , green" food 50 to 75% of the time is discussed, as well as to make water the primary drink and set a goal of 64 ounces water daily.    Weight /BMI 04/27/2020 04/24/2020 01/20/2020  WEIGHT 202 lb 195 lb 198 lb  HEIGHT 5\' 5"  5\' 5"  5' 7.75"  BMI 33.61 kg/m2 32.45 kg/m2 30.33 kg/m2      Hyperlipidemia LDL goal <100 Hyperlipidemia:Low fat diet discussed and encouraged.   Lipid Panel  Lab Results  Component Value Date   CHOL 197 06/28/2019   HDL 58 06/28/2019   LDLCALC 121 (H) 06/28/2019   TRIG 81 06/28/2019   CHOLHDL 3.4 06/28/2019     Needs to reduce fried and fatty foods Updated lab needed at/ before next visit.

## 2020-04-27 NOTE — Assessment & Plan Note (Signed)
°  Patient re-educated about  the importance of commitment to a  minimum of 150 minutes of exercise per week as able.  The importance of healthy food choices with portion control discussed, as well as eating regularly and within a 12 hour window most days. The need to choose "clean , green" food 50 to 75% of the time is discussed, as well as to make water the primary drink and set a goal of 64 ounces water daily.    Weight /BMI 04/27/2020 04/24/2020 01/20/2020  WEIGHT 202 lb 195 lb 198 lb  HEIGHT 5\' 5"  5\' 5"  5' 7.75"  BMI 33.61 kg/m2 32.45 kg/m2 30.33 kg/m2

## 2020-04-27 NOTE — Assessment & Plan Note (Signed)
Recovering well from uncomplicated acute flare. Educated re need to complete antibiotic course Educated about the disease and possible complications to be on the alert for

## 2020-04-27 NOTE — Assessment & Plan Note (Signed)
Hyperlipidemia:Low fat diet discussed and encouraged.   Lipid Panel  Lab Results  Component Value Date   CHOL 197 06/28/2019   HDL 58 06/28/2019   LDLCALC 121 (H) 06/28/2019   TRIG 81 06/28/2019   CHOLHDL 3.4 06/28/2019     Needs to reduce fried and fatty foods Updated lab needed at/ before next visit.

## 2020-04-27 NOTE — Assessment & Plan Note (Signed)
Patient in for follow up of recent ED visit Discharge summary, and laboratory and radiology data are reviewed, and any questions or concerns about recent visit  are discussed. Specific issues requiring follow up are specifically addressed.

## 2020-05-15 ENCOUNTER — Ambulatory Visit (HOSPITAL_COMMUNITY): Payer: 59

## 2020-05-25 ENCOUNTER — Other Ambulatory Visit: Payer: Self-pay

## 2020-05-25 ENCOUNTER — Ambulatory Visit (HOSPITAL_COMMUNITY)
Admission: RE | Admit: 2020-05-25 | Discharge: 2020-05-25 | Disposition: A | Discharge status: Home / Self Care | Payer: 59 | Source: Ambulatory Visit | Attending: Family Medicine | Admitting: Family Medicine

## 2020-05-25 DIAGNOSIS — Z1231 Encounter for screening mammogram for malignant neoplasm of breast: Secondary | ICD-10-CM | POA: Diagnosis not present

## 2020-07-25 ENCOUNTER — Ambulatory Visit (INDEPENDENT_AMBULATORY_CARE_PROVIDER_SITE_OTHER): Payer: 59 | Admitting: Family Medicine

## 2020-07-25 ENCOUNTER — Other Ambulatory Visit: Payer: Self-pay

## 2020-07-25 ENCOUNTER — Encounter: Payer: Self-pay | Admitting: Family Medicine

## 2020-07-25 VITALS — BP 134/86 | HR 61 | Temp 97.8°F | Resp 16 | Ht 65.0 in | Wt 199.4 lb

## 2020-07-25 DIAGNOSIS — Z23 Encounter for immunization: Secondary | ICD-10-CM | POA: Diagnosis not present

## 2020-07-25 DIAGNOSIS — R233 Spontaneous ecchymoses: Secondary | ICD-10-CM | POA: Diagnosis not present

## 2020-07-25 DIAGNOSIS — K5900 Constipation, unspecified: Secondary | ICD-10-CM | POA: Diagnosis not present

## 2020-07-25 DIAGNOSIS — I10 Essential (primary) hypertension: Secondary | ICD-10-CM | POA: Diagnosis not present

## 2020-07-25 NOTE — Assessment & Plan Note (Signed)
Has been seen in the past for constipation issues.  Despite dietary changes and over-the-counter agents.  She has had issues with it intermittently.  She continues to have issues with it most recently was taking Metamucil.  Though she reports right now she is doing okay

## 2020-07-25 NOTE — Assessment & Plan Note (Signed)
Controlled, no change in medication.  DASH diet is recommended.  In addition to 30 minutes of exercise at least 5 days a week.

## 2020-07-25 NOTE — Patient Instructions (Addendum)
I appreciate the opportunity to provide you with care for your health and wellness. Today we discussed: overall health   Follow up: Jan for her CPE- no pap needed  Labs-today or tomorrow at [facility] No referrals today Flu shot today-tylenol if arm gets sore  Please continue to practice social distancing to keep you, your family, and our community safe.  If you must go out, please wear a mask and practice good handwashing.  It was a pleasure to see you and I look forward to continuing to work together on your health and well-being. Please do not hesitate to call the office if you need care or have questions about your care.  Have a wonderful day and week. With Gratitude, Cherly Beach, DNP, AGNP-BC

## 2020-07-25 NOTE — Assessment & Plan Note (Signed)
Spontaneous bruising.  She denies having any other signs or symptoms of abnormal bleeding.  Reports that it stopped when she stopped taking Metamucil.  Given the spontaneous nature we will get updated labs just to make sure everything is doing okay.

## 2020-07-25 NOTE — Progress Notes (Signed)
Subjective:  Patient ID: Caitlyn Patterson, female    DOB: 1961-10-16  Age: 59 y.o. MRN: [id number]  CC:  Chief Complaint  Patient presents with  . Follow-up      HPI  HPI [name] is a 59 year old female patient of Dr. Griffin Dakin who presents today for her chronic condition follow-up.  Has a few acute issues that she would like to discuss such as spontaneous bruising of her arms about a month ago or so with the use of Metamucil.  She stopped the Metamucil when she felt like the bruising improved.  She denies having any excessive fatigue or other signs or symptoms of bleeding.  Additionally she would like the recommendation/discussion of shingles vaccine.  She denies having sleep issues.  She denies any trouble chewing or swallowing.  No changes in her appetite.  Denies having any constipation at this time.  Reports that she gets constipated at times and uses over-the-counter treatments.  She has also been seen by GI in the past.  She denies having any other issues or concerns to discuss today.  Denies having any fevers, chills, cough, shortness of breath, chest pain, headaches, dizziness.  Today patient denies signs and symptoms of COVID 19 infection including fever, chills, cough, shortness of breath, and headache. Past Medical, Surgical, Social History, Allergies, and Medications have been Reviewed.   Past Medical History:  Diagnosis Date  . BV (bacterial vaginosis)   . Educated about COVID-19 virus infection 04/12/2019  . Fibroid   . Hematuria 09/20/2015  . Hypertension   . Menopause 07/15/2014  . Pain with urination 09/20/2015  . Rectocele 04/20/2013  . Vaginal dryness 07/15/2014  . Yeast vaginitis 08/06/2013    Current Meds  Medication Sig  . acetaminophen (TYLENOL) 500 MG tablet Take 1,000 mg by mouth every 6 (six) hours as needed (pain.).   Marland Kitchen amLODipine (NORVASC) 2.5 MG tablet TAKE 1 TABLET BY MOUTH  DAILY  . Cholecalciferol (VITAMIN D3) 50 MCG (2000 UT) TABS Take 2,000  Units by mouth daily in the afternoon.    ROS:  Review of Systems  Constitutional: Negative.   HENT: Negative.   Eyes: Negative.   Respiratory: Negative.   Cardiovascular: Negative.   Gastrointestinal: Positive for constipation.  Genitourinary: Negative.   Musculoskeletal: Negative.   Skin: Negative.   Neurological: Negative.   Endo/Heme/Allergies: Negative.   Psychiatric/Behavioral: Negative.      Objective:   Today's Vitals: BP 134/86 (BP Location: Right Arm, Patient Position: Sitting, Cuff Size: Normal)   Pulse 61   Temp 97.8 F (36.6 C) (Temporal)   Resp 16   Ht 5\' 5"  (1.651 m)   Wt 199 lb 6.4 oz (90.4 kg)   LMP 07/23/2013   SpO2 98%   BMI 33.18 kg/m  Vitals with BMI 07/25/2020 04/27/2020 04/24/2020  Height 5\' 5"  5\' 5"  -  Weight 199 lbs 6 oz 202 lbs -  BMI 56.21 30.86 -  Systolic 578 469 629  Diastolic 86 78 72  Pulse 61 64 57     Physical Exam Vitals and nursing note reviewed.  Constitutional:      Appearance: Normal appearance. She is well-developed and well-groomed. She is obese.  HENT:     Head: Normocephalic and atraumatic.     Right Ear: External ear normal.     Left Ear: External ear normal.     Mouth/Throat:     Comments: Mask in place  Eyes:     General:  Right eye: No discharge.        Left eye: No discharge.     Conjunctiva/sclera: Conjunctivae normal.  Cardiovascular:     Rate and Rhythm: Normal rate and regular rhythm.     Pulses: Normal pulses.     Heart sounds: Normal heart sounds.  Pulmonary:     Effort: Pulmonary effort is normal.     Breath sounds: Normal breath sounds.  Musculoskeletal:        General: Normal range of motion.     Cervical back: Normal range of motion and neck supple.  Skin:    General: Skin is warm.  Neurological:     General: No focal deficit present.     Mental Status: She is alert and oriented to person, place, and time.  Psychiatric:        Attention and Perception: Attention normal.        Mood and  Affect: Mood normal.        Speech: Speech normal.        Behavior: Behavior normal. Behavior is cooperative.        Thought Content: Thought content normal.        Cognition and Memory: Cognition normal.        Judgment: Judgment normal.     Assessment   1. Spontaneous bruising   2. Essential hypertension   3. Constipation, unspecified constipation type   4. Need for influenza vaccination   5. Need for immunization against influenza     Tests ordered Orders Placed This Encounter  Procedures  . Flu Vaccine QUAD 36+ mos IM  . CBC  . Comprehensive metabolic panel     Plan: Please see assessment and plan per problem list above.   No orders of the defined types were placed in this encounter.   Patient to follow-up in Jan for CPE.  Perlie Mayo, NP

## 2020-07-25 NOTE — Assessment & Plan Note (Signed)

## 2020-08-02 ENCOUNTER — Ambulatory Visit: Payer: 59 | Admitting: Family Medicine

## 2020-08-16 ENCOUNTER — Ambulatory Visit (INDEPENDENT_AMBULATORY_CARE_PROVIDER_SITE_OTHER): Payer: 59

## 2020-08-16 ENCOUNTER — Other Ambulatory Visit: Payer: Self-pay

## 2020-08-16 DIAGNOSIS — Z23 Encounter for immunization: Secondary | ICD-10-CM | POA: Diagnosis not present

## 2020-09-18 ENCOUNTER — Ambulatory Visit: Payer: 59

## 2020-11-17 IMAGING — MG DIGITAL SCREENING BILAT W/ TOMO W/ CAD
6 of 12 series · 6 of 36 positions shown · non-contrast
Comparison: Previous exam(s).

CLINICAL DATA: Screening.

EXAM:
DIGITAL SCREENING BILATERAL MAMMOGRAM WITH TOMO AND CAD

[L CC synth-2D]
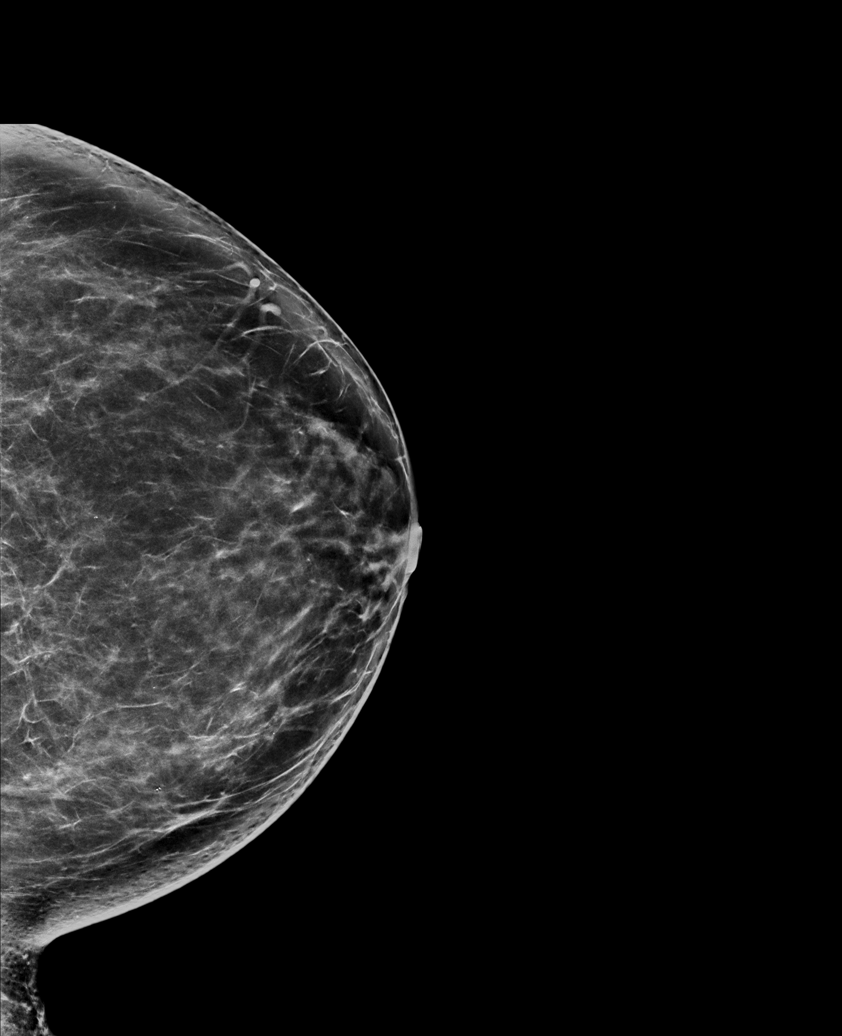

[R MLO synth-2D (1 of 2)]
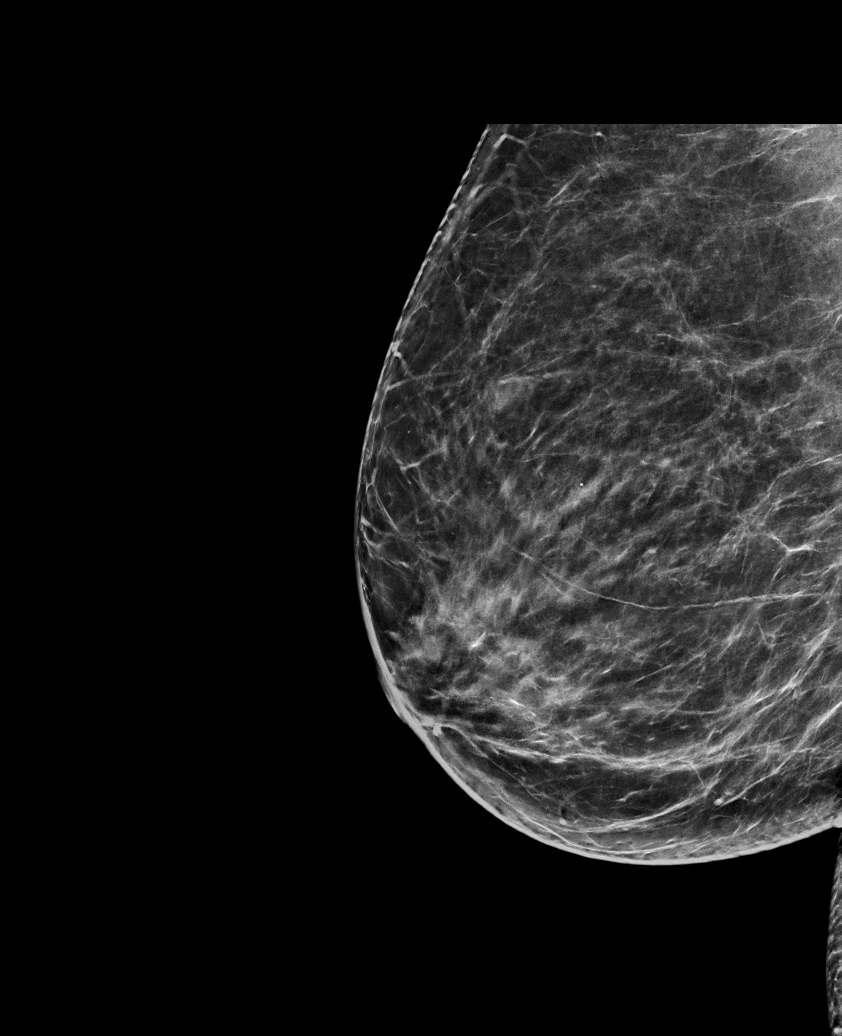

[L MLO synth-2D (1 of 2)]
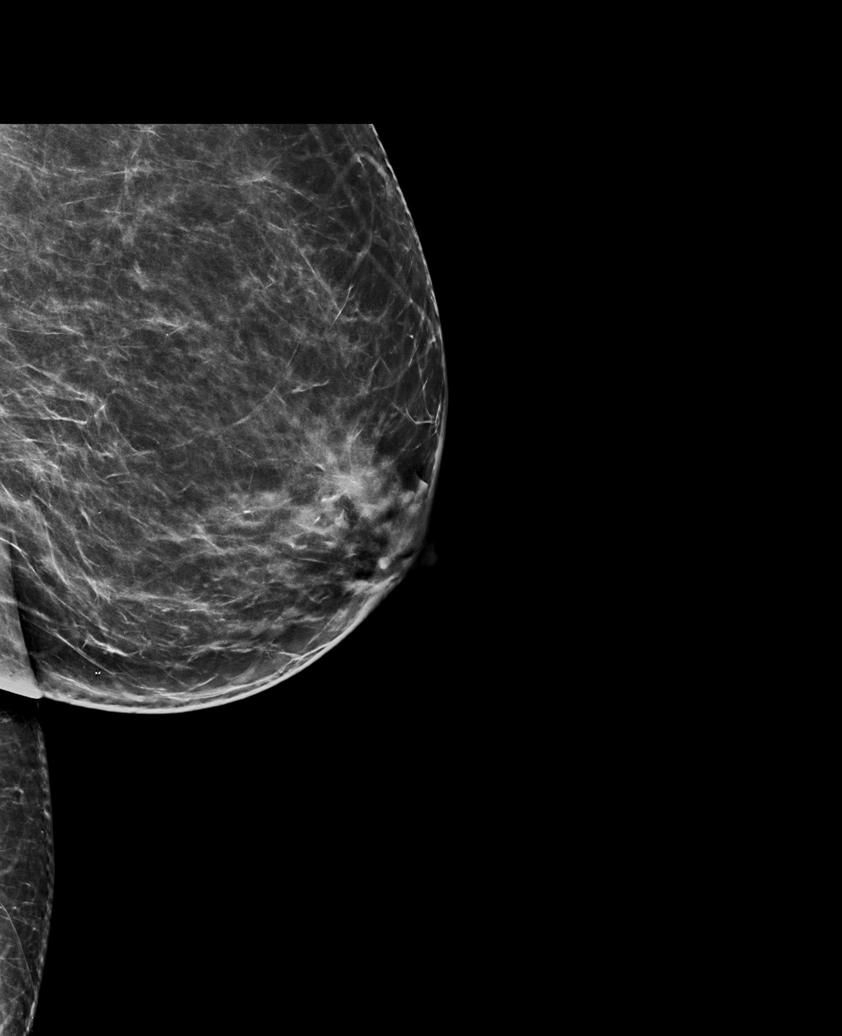

[R CC synth-2D]
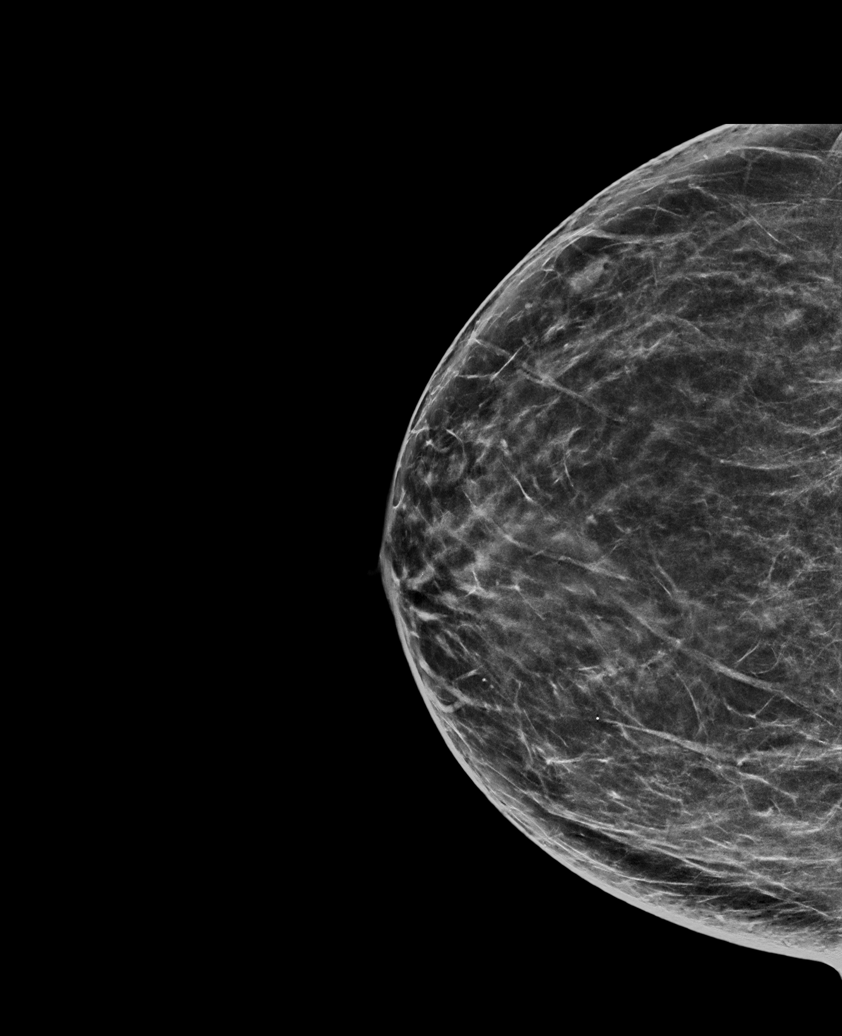

[L MLO synth-2D (2 of 2)]
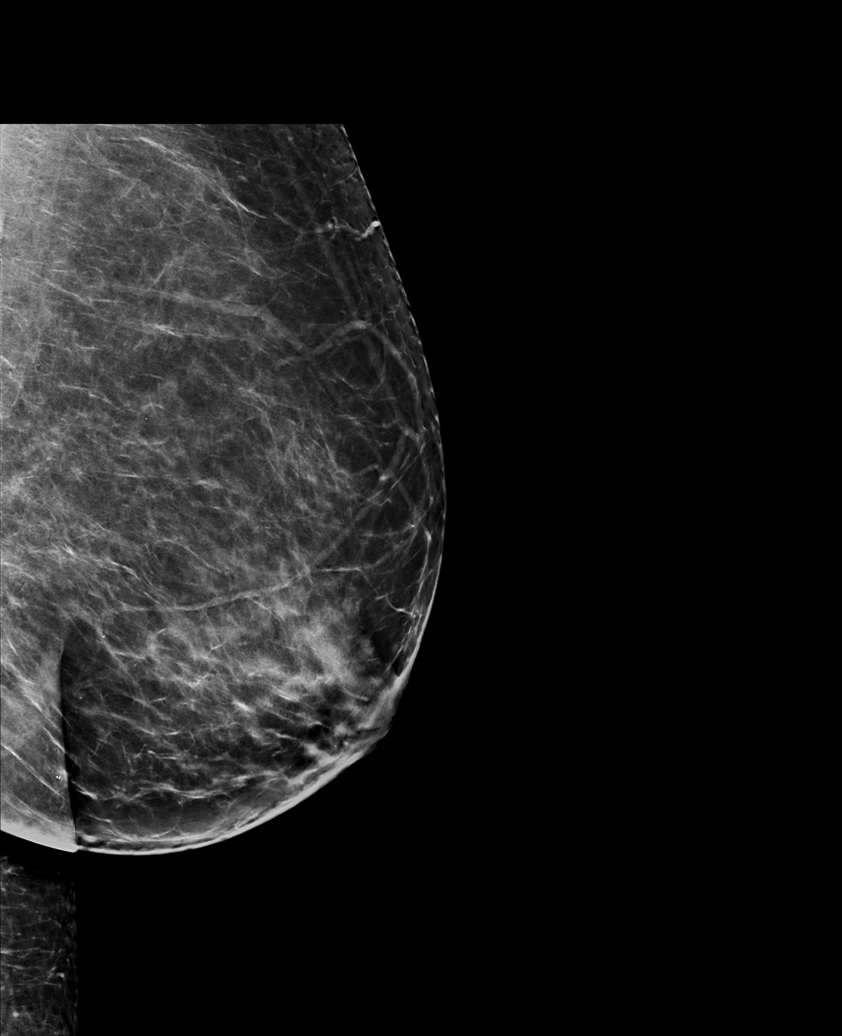

[R MLO synth-2D (2 of 2)]
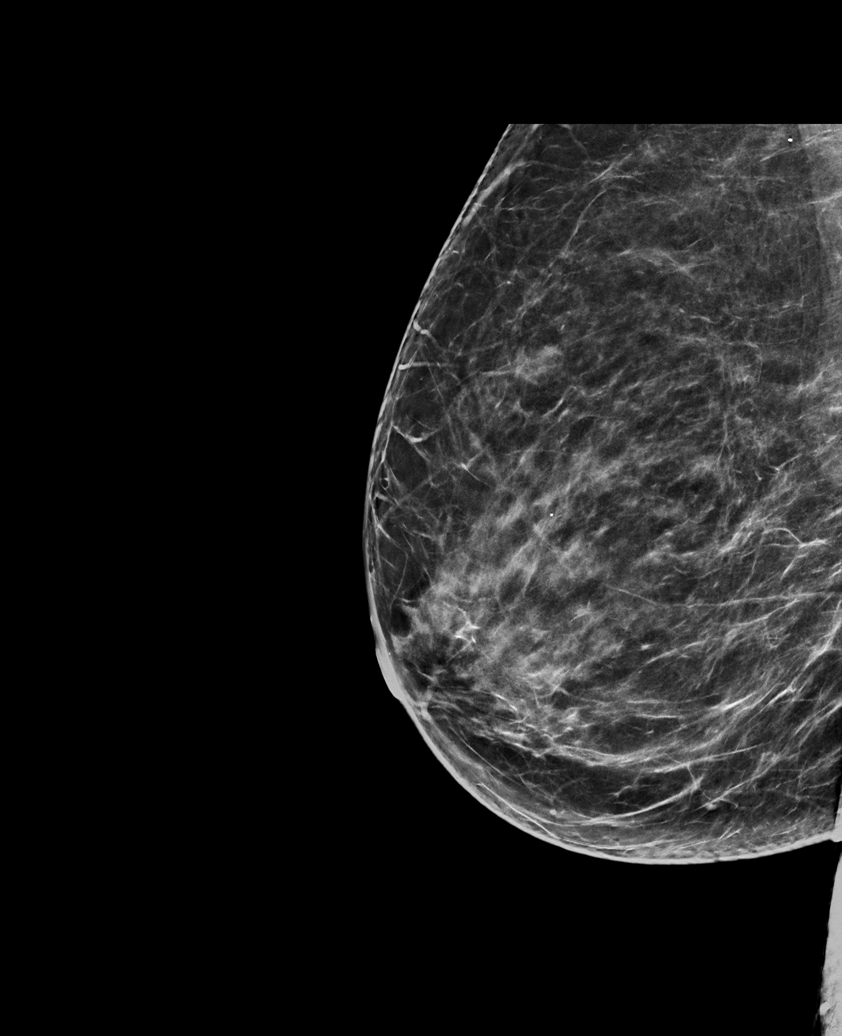

[6 of 36 positions shown; findings below may reference images not displayed]

ACR Breast Density Category b: There are scattered areas of
fibroglandular density.
FINDINGS: There are no findings suspicious for malignancy. Images were
processed with CAD.
IMPRESSION: No mammographic evidence of malignancy. A result letter of this
screening mammogram will be mailed directly to the patient.

RECOMMENDATION:
Screening mammogram in one year. (Code:[id number])

BI-RADS CATEGORY  1: Negative.

## 2020-11-20 ENCOUNTER — Ambulatory Visit: Payer: 59

## 2020-11-22 ENCOUNTER — Other Ambulatory Visit: Payer: Self-pay

## 2020-11-22 ENCOUNTER — Ambulatory Visit (INDEPENDENT_AMBULATORY_CARE_PROVIDER_SITE_OTHER): Payer: 59 | Admitting: *Deleted

## 2020-11-22 DIAGNOSIS — Z23 Encounter for immunization: Secondary | ICD-10-CM | POA: Diagnosis not present

## 2020-11-30 ENCOUNTER — Other Ambulatory Visit: Payer: Self-pay

## 2020-11-30 DIAGNOSIS — E559 Vitamin D deficiency, unspecified: Secondary | ICD-10-CM

## 2020-11-30 DIAGNOSIS — E785 Hyperlipidemia, unspecified: Secondary | ICD-10-CM

## 2020-11-30 DIAGNOSIS — I10 Essential (primary) hypertension: Secondary | ICD-10-CM

## 2020-12-05 ENCOUNTER — Encounter: Payer: 59 | Admitting: Family Medicine

## 2020-12-08 LAB — COMPREHENSIVE METABOLIC PANEL
ALT: 14 IU/L (ref 0–32)
AST: 16 IU/L (ref 0–40)
Albumin/Globulin Ratio: 1.3 (ref 1.2–2.2)
Albumin: 4.2 g/dL (ref 3.8–4.9)
Alkaline Phosphatase: 61 IU/L (ref 44–121)
BUN/Creatinine Ratio: 20 (ref 9–23)
BUN: 15 mg/dL (ref 6–24)
Bilirubin Total: 0.3 mg/dL (ref 0.0–1.2)
CO2: 24 mmol/L (ref 20–29)
Calcium: 9.2 mg/dL (ref 8.7–10.2)
Chloride: 105 mmol/L (ref 96–106)
Creatinine, Ser: 0.76 mg/dL (ref 0.57–1.00)
GFR calc Af Amer: 101 mL/min/{1.73_m2} (ref >59)
GFR calc non Af Amer: 87 mL/min/{1.73_m2} (ref >59)
Globulin, Total: 3.2 g/dL (ref 1.5–4.5)
Glucose: 92 mg/dL (ref 65–99)
Potassium: 4.3 mmol/L (ref 3.5–5.2)
Sodium: 142 mmol/L (ref 134–144)
Total Protein: 7.4 g/dL (ref 6.0–8.5)

## 2020-12-08 LAB — CBC
Hematocrit: 40 % (ref 34.0–46.6)
Hemoglobin: 13.5 g/dL (ref 11.1–15.9)
MCH: 32.4 pg (ref 26.6–33.0)
MCHC: 33.8 g/dL (ref 31.5–35.7)
MCV: 96 fL (ref 79–97)
Platelets: 168 10*3/uL (ref 150–450)
RBC: 4.17 x10E6/uL (ref 3.77–5.28)
RDW: 11.8 % (ref 11.7–15.4)
WBC: 5.3 10*3/uL (ref 3.4–10.8)

## 2020-12-08 LAB — LIPID PANEL
Chol/HDL Ratio: 3.4 ratio (ref 0.0–4.4)
Cholesterol, Total: 209 mg/dL — ABNORMAL HIGH (ref 100–199)
HDL: 62 mg/dL (ref >39)
LDL Chol Calc (NIH): 135 mg/dL — ABNORMAL HIGH (ref 0–99)
Triglycerides: 65 mg/dL (ref 0–149)
VLDL Cholesterol Cal: 12 mg/dL (ref 5–40)

## 2020-12-08 LAB — VITAMIN D 25 HYDROXY (VIT D DEFICIENCY, FRACTURES): Vit D, 25-Hydroxy: 24 ng/mL — ABNORMAL LOW (ref 30.0–100.0)

## 2020-12-08 LAB — TSH: TSH: 2.65 u[IU]/mL (ref 0.450–4.500)

## 2020-12-12 ENCOUNTER — Other Ambulatory Visit: Payer: Self-pay

## 2020-12-12 ENCOUNTER — Ambulatory Visit (INDEPENDENT_AMBULATORY_CARE_PROVIDER_SITE_OTHER): Payer: 59 | Admitting: Internal Medicine

## 2020-12-12 ENCOUNTER — Encounter: Payer: Self-pay | Admitting: Internal Medicine

## 2020-12-12 VITALS — BP 115/72 | HR 73 | Temp 98.1°F | Resp 16 | Ht 65.0 in | Wt 200.1 lb

## 2020-12-12 DIAGNOSIS — E785 Hyperlipidemia, unspecified: Secondary | ICD-10-CM | POA: Diagnosis not present

## 2020-12-12 DIAGNOSIS — E559 Vitamin D deficiency, unspecified: Secondary | ICD-10-CM

## 2020-12-12 DIAGNOSIS — I10 Essential (primary) hypertension: Secondary | ICD-10-CM | POA: Diagnosis not present

## 2020-12-12 DIAGNOSIS — Z0000 Encounter for general adult medical examination without abnormal findings: Secondary | ICD-10-CM

## 2020-12-12 NOTE — Assessment & Plan Note (Signed)
Lipid panel reviewed Diet controlled

## 2020-12-12 NOTE — Assessment & Plan Note (Signed)
Annual exam as documented. Counseling done  re healthy lifestyle involving commitment to 150 minutes exercise per week, heart healthy diet, and attaining healthy weight.The importance of adequate sleep also discussed. Changes in health habits are decided on by the patient with goals and time frames  set for achieving them. Immunization and cancer screening needs are specifically addressed at this visit. 

## 2020-12-12 NOTE — Assessment & Plan Note (Signed)
On Vitamin D 2000 IU QD 

## 2020-12-12 NOTE — Progress Notes (Signed)
Established Patient Office Visit  Subjective:  Patient ID: Caitlyn Patterson, female    DOB: 08/14/1961  Age: 60 y.o. MRN: [id number]  CC:  Chief Complaint  Patient presents with  . Annual Exam    Annual exam no pap everything is good     HPI Caitlyn Patterson is a 60 year old female with PMH of HTN and Vitamin D deficiency who presents for annual physical exam.  She has been in good health overall. She is up-to-date with COVID and flu vaccine.  Last colonoscopy in 2021. Last Mammography in 05/2020. Last PAP smear in 04/2020.  She has been trying to watch diet and lose weight.  Blood tests were reviewed and discussed with the patient in detail.  Past Medical History:  Diagnosis Date  . BV (bacterial vaginosis)   . Educated about COVID-19 virus infection 04/12/2019  . Fibroid   . Hematuria 09/20/2015  . Hypertension   . Menopause 07/15/2014  . Pain with urination 09/20/2015  . Rectocele 04/20/2013  . Vaginal dryness 07/15/2014  . Yeast vaginitis 08/06/2013    Past Surgical History:  Procedure Laterality Date  . COLONOSCOPY  2010   Dr. Oneida Alar: normal, small internal hemorrhoids  . COLONOSCOPY N/A 12/01/2019   Procedure: COLONOSCOPY;  Surgeon: Danie Binder, MD;  Location: [facility];  Service: Endoscopy;  Laterality: N/A;  12:00  . NO PAST SURGERIES    . POLYPECTOMY  12/01/2019   Procedure: POLYPECTOMY;  Surgeon: Danie Binder, MD;  Location: [facility];  Service: Endoscopy;;  transverse colon  . varicose veins      Family History  Problem Relation Age of Onset  . Cancer Maternal Grandmother        colon   . Stroke Mother   . Hypertension Mother   . Cancer Maternal Aunt        breast  . Pneumonia Father   . Hypertension Brother   . Hypertension Sister   . Varicose Veins Sister   . Diabetes Paternal Aunt   . Colon polyps Neg Hx     Social History   Socioeconomic History  . Marital status: Married    Spouse name: Not on file  . Number of children:  Not on file  . Years of education: Not on file  . Highest education level: Not on file  Occupational History  . Not on file  Tobacco Use  . Smoking status: Never Smoker  . Smokeless tobacco: Never Used  Vaping Use  . Vaping Use: Never used  Substance and Sexual Activity  . Alcohol use: Yes    Alcohol/week: 2.0 - 3.0 standard drinks    Types: 1 - 2 Glasses of wine, 1 Shots of liquor per week    Comment: occ  . Drug use: No  . Sexual activity: Yes    Birth control/protection: Post-menopausal  Other Topics Concern  . Not on file  Social History Narrative  . Not on file   Social Determinants of Health   Financial Resource Strain: Not on file  Food Insecurity: Not on file  Transportation Needs: Not on file  Physical Activity: Not on file  Stress: Not on file  Social Connections: Not on file  Intimate Partner Violence: Not on file    Outpatient Medications Prior to Visit  Medication Sig Dispense Refill  . acetaminophen (TYLENOL) 500 MG tablet Take 1,000 mg by mouth every 6 (six) hours as needed (pain.).     Marland Kitchen amLODipine (NORVASC) 2.5 MG  tablet TAKE 1 TABLET BY MOUTH  DAILY 90 tablet 3  . Cholecalciferol (VITAMIN D3) 50 MCG (2000 UT) TABS Take 2,000 Units by mouth daily in the afternoon.     No facility-administered medications prior to visit.    Allergies  Allergen Reactions  . Amoxicillin Rash    REACTION: bumps and red itchy rash Did it involve swelling of the face/tongue/throat, SOB, or low BP? No Did it involve sudden or severe rash/hives, skin peeling, or any reaction on the inside of your mouth or nose? Yes Did you need to seek medical attention at a hospital or doctor's office? No When did it last happen?Early 2000s If all above answers are "NO", may proceed with cephalosporin use.   . Bactrim [Sulfamethoxazole-Trimethoprim] Rash  . Ibuprofen Rash    ROS Review of Systems  Constitutional: Negative for chills and fever.  HENT: Negative for congestion,  sinus pressure, sinus pain and sore throat.   Eyes: Negative for pain and discharge.  Respiratory: Negative for cough and shortness of breath.   Cardiovascular: Negative for chest pain and palpitations.  Gastrointestinal: Negative for abdominal pain, constipation, diarrhea, nausea and vomiting.  Endocrine: Negative for polydipsia and polyuria.  Genitourinary: Negative for dysuria and hematuria.  Musculoskeletal: Negative for neck pain and neck stiffness.  Skin: Negative for rash.  Neurological: Negative for dizziness and weakness.  Psychiatric/Behavioral: Negative for agitation and behavioral problems.      Objective:    Physical Exam Vitals reviewed.  Constitutional:      General: She is not in acute distress.    Appearance: She is not diaphoretic.  HENT:     Head: Normocephalic and atraumatic.     Nose: Nose normal. No congestion.     Mouth/Throat:     Mouth: Mucous membranes are moist.     Pharynx: No posterior oropharyngeal erythema.  Eyes:     General: No scleral icterus.    Extraocular Movements: Extraocular movements intact.     Pupils: Pupils are equal, round, and reactive to light.  Cardiovascular:     Rate and Rhythm: Normal rate and regular rhythm.     Pulses: Normal pulses.     Heart sounds: Normal heart sounds. No murmur heard.   Pulmonary:     Breath sounds: Normal breath sounds. No wheezing or rales.  Abdominal:     Palpations: Abdomen is soft.     Tenderness: There is no abdominal tenderness.  Musculoskeletal:     Cervical back: Neck supple. No tenderness.     Right lower leg: No edema.     Left lower leg: No edema.  Skin:    General: Skin is warm.     Findings: No rash.  Neurological:     General: No focal deficit present.     Mental Status: She is alert and oriented to person, place, and time.     Sensory: No sensory deficit.     Motor: No weakness.  Psychiatric:        Mood and Affect: Mood normal.        Behavior: Behavior normal.     BP  115/72 (BP Location: Right Arm, Patient Position: Sitting, Cuff Size: Normal)   Pulse 73   Temp 98.1 F (36.7 C) (Oral)   Resp 16   Ht 5\' 5"  (1.651 m)   Wt 200 lb 1.9 oz (90.8 kg)   LMP 07/23/2013   SpO2 96%   BMI 33.30 kg/m  Wt Readings from Last 3 Encounters:  12/12/20 200 lb 1.9 oz (90.8 kg)  07/25/20 199 lb 6.4 oz (90.4 kg)  04/27/20 202 lb (91.6 kg)     There are no preventive care reminders to display for this patient.  There are no preventive care reminders to display for this patient.  Lab Results  Component Value Date   TSH 2.650 12/07/2020   Lab Results  Component Value Date   WBC 5.3 12/07/2020   HGB 13.5 12/07/2020   HCT 40.0 12/07/2020   MCV 96 12/07/2020   PLT 168 12/07/2020   Lab Results  Component Value Date   NA 142 12/07/2020   K 4.3 12/07/2020   CO2 24 12/07/2020   GLUCOSE 92 12/07/2020   BUN 15 12/07/2020   CREATININE 0.76 12/07/2020   BILITOT 0.3 12/07/2020   ALKPHOS 61 12/07/2020   AST 16 12/07/2020   ALT 14 12/07/2020   PROT 7.4 12/07/2020   ALBUMIN 4.2 12/07/2020   CALCIUM 9.2 12/07/2020   ANIONGAP 7 04/24/2020   Lab Results  Component Value Date   CHOL 209 (H) 12/07/2020   Lab Results  Component Value Date   HDL 62 12/07/2020   Lab Results  Component Value Date   LDLCALC 135 (H) 12/07/2020   Lab Results  Component Value Date   TRIG 65 12/07/2020   Lab Results  Component Value Date   CHOLHDL 3.4 12/07/2020   Lab Results  Component Value Date   HGBA1C 5.6 02/17/2015      Assessment & Plan:   Problem List Items Addressed This Visit      Annual physical exam - Primary   Annual exam as documented. Counseling done  re healthy lifestyle involving commitment to 150 minutes exercise per week, heart healthy diet, and attaining healthy weight.The importance of adequate sleep also discussed. Changes in health habits are decided on by the patient with goals and time frames  set for achieving them. Immunization and cancer  screening needs are specifically addressed at this visit.      Cardiovascular and Mediastinum   Essential hypertension    BP Readings from Last 1 Encounters:  12/12/20 115/72   Well-controlled with Amlodipine Counseled for compliance with the medications Advised DASH diet and moderate exercise/walking, at least 150 mins/week         Other   Vitamin D deficiency    On Vitamin D 2000 IU QD            Hyperlipidemia    Lipid panel reviewed Diet controlled         No orders of the defined types were placed in this encounter.   Follow-up: Return in about 5 months (around 05/12/2021).    Lindell Spar, MD

## 2020-12-12 NOTE — Patient Instructions (Signed)
Please continue to take medications as prescribed.  Please continue to follow DASH diet and perform moderate exercise/walking at least 150 mins/week.  DASH stands for Dietary Approaches to Stop Hypertension. The DASH diet is a healthy-eating plan designed to help treat or prevent high blood pressure (hypertension).  The DASH diet includes foods that are rich in potassium, calcium and magnesium. These nutrients help control blood pressure. The diet limits foods that are high in sodium, saturated fat and added sugars.  Studies have shown that the DASH diet can lower blood pressure in as little as two weeks. The diet can also lower low-density lipoprotein (LDL or "bad") cholesterol levels in the blood. High blood pressure and high LDL cholesterol levels are two major risk factors for heart disease and stroke.    DASH diet: Recommended servings The DASH diet provides daily and weekly nutritional goals. The number of servings you should have depends on your daily calorie needs.  Here's a look at the recommended servings from each food group for a 2,000-calorie-a-day DASH diet:  Grains: 6 to 8 servings a day. One serving is one slice bread, 1 ounce dry cereal, or 1/2 cup cooked cereal, rice or pasta. Vegetables: 4 to 5 servings a day. One serving is 1 cup raw leafy green vegetable, 1/2 cup cut-up raw or cooked vegetables, or 1/2 cup vegetable juice. Fruits: 4 to 5 servings a day. One serving is one medium fruit, 1/2 cup fresh, frozen or canned fruit, or 1/2 cup fruit juice. Fat-free or low-fat dairy products: 2 to 3 servings a day. One serving is 1 cup milk or yogurt, or 1 1/2 ounces cheese. Lean meats, poultry and fish: six 1-ounce servings or fewer a day. One serving is 1 ounce cooked meat, poultry or fish, or 1 egg. Nuts, seeds and legumes: 4 to 5 servings a week. One serving is 1/3 cup nuts, 2 tablespoons peanut butter, 2 tablespoons seeds, or 1/2 cup cooked legumes (dried beans or peas). Fats  and oils: 2 to 3 servings a day. One serving is 1 teaspoon soft margarine, 1 teaspoon vegetable oil, 1 tablespoon mayonnaise or 2 tablespoons salad dressing. Sweets and added sugars: 5 servings or fewer a week. One serving is 1 tablespoon sugar, jelly or jam, 1/2 cup sorbet, or 1 cup lemonade.

## 2020-12-12 NOTE — Assessment & Plan Note (Signed)
BP Readings from Last 1 Encounters:  12/12/20 115/72   Well-controlled with Amlodipine Counseled for compliance with the medications Advised DASH diet and moderate exercise/walking, at least 150 mins/week

## 2020-12-13 ENCOUNTER — Encounter: Payer: 59 | Admitting: Internal Medicine

## 2021-01-23 ENCOUNTER — Other Ambulatory Visit: Payer: Self-pay | Admitting: Family Medicine

## 2021-05-10 ENCOUNTER — Ambulatory Visit: Payer: 59 | Admitting: Family Medicine

## 2021-05-11 ENCOUNTER — Other Ambulatory Visit (HOSPITAL_COMMUNITY): Payer: Self-pay | Admitting: Family Medicine

## 2021-05-11 DIAGNOSIS — Z1231 Encounter for screening mammogram for malignant neoplasm of breast: Secondary | ICD-10-CM

## 2021-05-28 ENCOUNTER — Ambulatory Visit (HOSPITAL_COMMUNITY)
Admission: RE | Admit: 2021-05-28 | Discharge: 2021-05-28 | Disposition: A | Discharge status: Home / Self Care | Payer: 59 | Source: Ambulatory Visit | Attending: Family Medicine | Admitting: Family Medicine

## 2021-05-28 ENCOUNTER — Other Ambulatory Visit: Payer: Self-pay

## 2021-05-28 DIAGNOSIS — Z1231 Encounter for screening mammogram for malignant neoplasm of breast: Secondary | ICD-10-CM

## 2021-06-07 ENCOUNTER — Ambulatory Visit: Payer: 59 | Admitting: Family Medicine

## 2021-06-20 ENCOUNTER — Encounter: Payer: Self-pay | Admitting: Family Medicine

## 2021-06-20 ENCOUNTER — Ambulatory Visit (INDEPENDENT_AMBULATORY_CARE_PROVIDER_SITE_OTHER): Payer: 59 | Admitting: Family Medicine

## 2021-06-20 ENCOUNTER — Other Ambulatory Visit: Payer: Self-pay

## 2021-06-20 VITALS — BP 132/84 | HR 60 | Temp 98.2°F | Resp 18 | Ht 65.0 in | Wt 188.0 lb

## 2021-06-20 DIAGNOSIS — R238 Other skin changes: Secondary | ICD-10-CM | POA: Insufficient documentation

## 2021-06-20 DIAGNOSIS — E785 Hyperlipidemia, unspecified: Secondary | ICD-10-CM

## 2021-06-20 DIAGNOSIS — I10 Essential (primary) hypertension: Secondary | ICD-10-CM

## 2021-06-20 DIAGNOSIS — R233 Spontaneous ecchymoses: Secondary | ICD-10-CM

## 2021-06-20 DIAGNOSIS — E559 Vitamin D deficiency, unspecified: Secondary | ICD-10-CM

## 2021-06-20 DIAGNOSIS — E669 Obesity, unspecified: Secondary | ICD-10-CM

## 2021-06-20 DIAGNOSIS — R87610 Atypical squamous cells of undetermined significance on cytologic smear of cervix (ASC-US): Secondary | ICD-10-CM

## 2021-06-20 NOTE — Patient Instructions (Signed)
F/U early Feb , call if you need me before  Please get covid booster  Take calcium 1000 to 1200 mg daily and vit D3, 2000 IU daily ,  both are OTC  You can call and come for flu vaccine  CONGRATS  on good health habits  You are referred for pap  Fasting cBC, lipid, cmp and eGFR, TSH and vit d 1 week before next appt  You are referred to hematology re bruising   It is important that you exercise regularly at least 30 minutes 5 times a week. If you develop chest pain, have severe difficulty breathing, or feel very tired, stop exercising immediately and seek medical attention  Think about what you will eat, plan ahead. Choose " clean, green, fresh or frozen" over canned, processed or packaged foods which are more sugary, salty and fatty. 70 to 75% of food eaten should be vegetables and fruit. Three meals at set times with snacks allowed between meals, but they must be fruit or vegetables. Aim to eat over a 12 hour period , example 7 am to 7 pm, and STOP after  your last meal of the day. Drink water,generally about 64 ounces per day, no other drink is as healthy. Fruit juice is best enjoyed in a healthy way, by EATING the fruit.

## 2021-06-20 NOTE — Progress Notes (Signed)
Caitlyn Patterson     MRN: [id number]      DOB: October 25, 1961   HPI Caitlyn Patterson is here for follow up and re-evaluation of chronic medical conditions, medication management and review of any available recent lab and radiology data.  Preventive health is updated, specifically  Cancer screening and Immunization.   Questions or concerns regarding consultations or procedures which the PT has had in the interim are  addressed. The PT denies any adverse reactions to current medications since the last visit.  2 month h/o unexplained bruising, no known  f/h of blood disorder ROS Denies recent fever or chills. Denies sinus pressure, nasal congestion, ear pain or sore throat. Denies chest congestion, productive cough or wheezing. Denies chest pains, palpitations and leg swelling Denies abdominal pain, nausea, vomiting,diarrhea or constipation.   Denies dysuria, frequency, hesitancy or incontinence. Denies joint pain, swelling and limitation in mobility. Denies headaches, seizures, numbness, or tingling. Denies depression, anxiety or insomnia. Denies skin break down or rash.   PE  BP 132/84 (BP Location: Right Arm, Patient Position: Sitting, Cuff Size: Large)   Pulse 60   Temp 98.2 F (36.8 C)   Resp 18   Ht '5\' 5"'$  (1.651 m)   Wt 188 lb (85.3 kg)   LMP 07/23/2013   SpO2 95%   BMI 31.28 kg/m   Patient alert and oriented and in no cardiopulmonary distress.  HEENT: No facial asymmetry, EOMI,     Neck supple .  Chest: Clear to auscultation bilaterally.  CVS: S1, S2 no murmurs, no S3.Regular rate.  ABD: Soft non tender.   Ext: No edema  MS: Adequate ROM spine, shoulders, hips and knees.  Skin: Intact, no ulcerations or rash noted.  Psych: Good eye contact, normal affect. Memory intact not anxious or depressed appearing.  CNS: CN 2-12 intact, power,  normal throughout.no focal deficits noted.   Assessment & Plan  Spontaneous bruising 2 month h/o increased spontaneous bruising  , but noted since 07/2020, refer hematology  Easy bruising 2 month h/o unexplained bruising, refer hematology  Essential hypertension Controlled, no change in medication DASH diet and commitment to daily physical activity for a minimum of 30 minutes discussed and encouraged, as a part of hypertension management. The importance of attaining a healthy weight is also discussed.  BP/Weight 06/20/2021 12/12/2020 07/25/2020 04/27/2020 04/24/2020 01/20/2020 123XX123  Systolic BP Q000111Q [id number] Q000111Q [id number] 94  Diastolic BP 84 72 86 78 72 72 47  Wt. (Lbs) 188 200.12 199.4 202 195 198 190  BMI 31.28 33.3 33.18 33.61 32.45 30.33 31.62       Obesity (BMI 30.0-34.9) Improved  Patient re-educated about  the importance of commitment to a  minimum of 150 minutes of exercise per week as able.  The importance of healthy food choices with portion control discussed, as well as eating regularly and within a 12 hour window most days. The need to choose "clean , green" food 50 to 75% of the time is discussed, as well as to make water the primary drink and set a goal of 64 ounces water daily.    Weight /BMI 06/20/2021 12/12/2020 07/25/2020  WEIGHT 188 lb 200 lb 1.9 oz 199 lb 6.4 oz  HEIGHT '5\' 5"'$  '5\' 5"'$  '5\' 5"'$   BMI 31.28 kg/m2 33.3 kg/m2 33.18 kg/m2      Hyperlipidemia LDL goal <100 Hyperlipidemia:Low fat diet discussed and encouraged.   Lipid Panel  Lab Results  Component Value Date   CHOL 209 (H)  12/07/2020   HDL 62 12/07/2020   LDLCALC 135 (H) 12/07/2020   TRIG 65 12/07/2020   CHOLHDL 3.4 12/07/2020  needs to reduce fat in diet Updated lab needed at/ before next visit.      Vitamin D deficiency Needs to supplement with 2000 IU daily

## 2021-06-20 NOTE — Assessment & Plan Note (Signed)
2 month h/o increased spontaneous bruising , but noted since 07/2020, refer hematology

## 2021-06-24 ENCOUNTER — Encounter: Payer: Self-pay | Admitting: Family Medicine

## 2021-06-24 NOTE — Assessment & Plan Note (Signed)
Needs to supplement with 2000 IU daily

## 2021-06-24 NOTE — Assessment & Plan Note (Signed)
Improved  Patient re-educated about  the importance of commitment to a  minimum of 150 minutes of exercise per week as able.  The importance of healthy food choices with portion control discussed, as well as eating regularly and within a 12 hour window most days. The need to choose "clean , green" food 50 to 75% of the time is discussed, as well as to make water the primary drink and set a goal of 64 ounces water daily.    Weight /BMI 06/20/2021 12/12/2020 07/25/2020  WEIGHT 188 lb 200 lb 1.9 oz 199 lb 6.4 oz  HEIGHT '5\' 5"'$  '5\' 5"'$  '5\' 5"'$   BMI 31.28 kg/m2 33.3 kg/m2 33.18 kg/m2

## 2021-06-24 NOTE — Assessment & Plan Note (Signed)
Controlled, no change in medication DASH diet and commitment to daily physical activity for a minimum of 30 minutes discussed and encouraged, as a part of hypertension management. The importance of attaining a healthy weight is also discussed.  BP/Weight 06/20/2021 12/12/2020 07/25/2020 04/27/2020 04/24/2020 01/20/2020 123XX123  Systolic BP Q000111Q [id number] Q000111Q [id number] 94  Diastolic BP 84 72 86 78 72 72 47  Wt. (Lbs) 188 200.12 199.4 202 195 198 190  BMI 31.28 33.3 33.18 33.61 32.45 30.33 31.62

## 2021-06-24 NOTE — Assessment & Plan Note (Signed)
2 month h/o unexplained bruising, refer hematology

## 2021-06-24 NOTE — Assessment & Plan Note (Signed)
Hyperlipidemia:Low fat diet discussed and encouraged.   Lipid Panel  Lab Results  Component Value Date   CHOL 209 (H) 12/07/2020   HDL 62 12/07/2020   LDLCALC 135 (H) 12/07/2020   TRIG 65 12/07/2020   CHOLHDL 3.4 12/07/2020  needs to reduce fat in diet Updated lab needed at/ before next visit.

## 2021-07-12 ENCOUNTER — Telehealth: Payer: 59 | Admitting: Family Medicine

## 2021-07-12 ENCOUNTER — Other Ambulatory Visit: Payer: Self-pay

## 2021-07-16 ENCOUNTER — Encounter: Payer: Self-pay | Admitting: Internal Medicine

## 2021-07-16 ENCOUNTER — Telehealth (INDEPENDENT_AMBULATORY_CARE_PROVIDER_SITE_OTHER): Payer: 59 | Admitting: Internal Medicine

## 2021-07-16 ENCOUNTER — Other Ambulatory Visit: Payer: Self-pay

## 2021-07-16 DIAGNOSIS — U071 COVID-19: Secondary | ICD-10-CM

## 2021-07-16 MED ORDER — BENZONATATE 100 MG PO CAPS
100.0000 mg | ORAL_CAPSULE | Freq: Two times a day (BID) | ORAL | 0 refills | Status: DC | PRN
Start: 2021-07-16 — End: 2022-01-03

## 2021-07-16 NOTE — Progress Notes (Signed)
Virtual Visit via Telephone Note   This visit type was conducted due to national recommendations for restrictions regarding the COVID-19 Pandemic (e.g. social distancing) in an effort to limit this patient's exposure and mitigate transmission in our community.  Due to her co-morbid illnesses, this patient is at least at moderate risk for complications without adequate follow up.  This format is felt to be most appropriate for this patient at this time.  The patient did not have access to video technology/had technical difficulties with video requiring transitioning to audio format only (telephone).  All issues noted in this document were discussed and addressed.  No physical exam could be performed with this format.  Evaluation Performed:  Follow-up visit  Date:  07/16/2021   ID:  Caitlyn Patterson, DOB Apr 07, 1961, MRN [id number]  Patient Location: Home Provider Location: Office/Clinic  Participants: Patient Location of Patient: Home Location of Provider: Telehealth Consent was obtain for visit to be over via telehealth. I verified that I am speaking with the correct person using two identifiers.  PCP:  Fayrene Helper, MD   Chief Complaint:  Cough  History of Present Illness:    Caitlyn Patterson is a 60 y.o. female who has a televisit for c/o cough for last 5 days. She tested positive for COVID about 4 days ago. Denies any dyspnea, wheezing, chest pain or palpitations. She has had 3 doses of COVID vaccine.  The patient does have symptoms concerning for COVID-19 infection (fever, chills, cough, or new shortness of breath).   Past Medical, Surgical, Social History, Allergies, and Medications have been Reviewed.  Past Medical History:  Diagnosis Date   BV (bacterial vaginosis)    Educated about COVID-19 virus infection 04/12/2019   Fibroid    Hematuria 09/20/2015   Hypertension    Menopause 07/15/2014   Pain with urination 09/20/2015   Rectocele 04/20/2013   Vaginal dryness  07/15/2014   Yeast vaginitis 08/06/2013   Past Surgical History:  Procedure Laterality Date   COLONOSCOPY  2010   Dr. Oneida Alar: normal, small internal hemorrhoids   COLONOSCOPY N/A 12/01/2019   Procedure: COLONOSCOPY;  Surgeon: Danie Binder, MD;  Location: [facility];  Service: Endoscopy;  Laterality: N/A;  12:00   NO PAST SURGERIES     POLYPECTOMY  12/01/2019   Procedure: POLYPECTOMY;  Surgeon: Danie Binder, MD;  Location: [facility];  Service: Endoscopy;;  transverse colon   varicose veins       Current Meds  Medication Sig   acetaminophen (TYLENOL) 500 MG tablet Take 1,000 mg by mouth every 6 (six) hours as needed (pain.).    amLODipine (NORVASC) 2.5 MG tablet TAKE 1 TABLET BY MOUTH  DAILY   Cholecalciferol (VITAMIN D3) 50 MCG (2000 UT) TABS Take 2,000 Units by mouth daily in the afternoon.     Allergies:   Amoxicillin, Bactrim [sulfamethoxazole-trimethoprim], and Ibuprofen   ROS:   Please see the history of present illness.     All other systems reviewed and are negative.   Labs/Other Tests and Data Reviewed:    Recent Labs: 12/07/2020: ALT 14; BUN 15; Creatinine, Ser 0.76; Hemoglobin 13.5; Platelets 168; Potassium 4.3; Sodium 142; TSH 2.650   Recent Lipid Panel Lab Results  Component Value Date/Time   CHOL 209 (H) 12/07/2020 01:16 PM   TRIG 65 12/07/2020 01:16 PM   HDL 62 12/07/2020 01:16 PM   CHOLHDL 3.4 12/07/2020 01:16 PM   CHOLHDL 3.4 06/28/2019 07:44 AM  LDLCALC 135 (H) 12/07/2020 01:16 PM   LDLCALC 121 (H) 06/28/2019 07:44 AM    Wt Readings from Last 3 Encounters:  06/20/21 188 lb (85.3 kg)  12/12/20 200 lb 1.9 oz (90.8 kg)  07/25/20 199 lb 6.4 oz (90.4 kg)     ASSESSMENT & PLAN:    COVID-19 infection Outside of treatment window currently Tessalon PRN for cough Afebrile now Advised to contact if any new symptoms  Time:   Today, I have spent 9 minutes reviewing the chart, including problem list, medications, and with the patient with  telehealth technology discussing the above problems.   Medication Adjustments/Labs and Tests Ordered: Current medicines are reviewed at length with the patient today.  Concerns regarding medicines are outlined above.   Tests Ordered: No orders of the defined types were placed in this encounter.   Medication Changes: No orders of the defined types were placed in this encounter.    Note: This dictation was prepared with Dragon dictation along with smaller phrase technology. Similar sounding words can be transcribed inadequately or may not be corrected upon review. Any transcriptional errors that result from this process are unintentional.      Disposition:  Follow up  Signed, Lindell Spar, MD  07/16/2021 9:57 AM     [place]

## 2021-08-01 ENCOUNTER — Ambulatory Visit (INDEPENDENT_AMBULATORY_CARE_PROVIDER_SITE_OTHER): Payer: 59 | Admitting: Obstetrics & Gynecology

## 2021-08-01 ENCOUNTER — Encounter: Payer: Self-pay | Admitting: Obstetrics & Gynecology

## 2021-08-01 ENCOUNTER — Other Ambulatory Visit: Payer: Self-pay

## 2021-08-01 VITALS — BP 132/86 | HR 76 | Ht 64.0 in | Wt 187.8 lb

## 2021-08-01 DIAGNOSIS — R87610 Atypical squamous cells of undetermined significance on cytologic smear of cervix (ASC-US): Secondary | ICD-10-CM | POA: Diagnosis not present

## 2021-08-01 DIAGNOSIS — Z01419 Encounter for gynecological examination (general) (routine) without abnormal findings: Secondary | ICD-10-CM

## 2021-08-01 NOTE — Progress Notes (Signed)
  GYN EXAM Patient name: Caitlyn Patterson MRN [id number]  Date of birth: 1961/07/26 Chief Complaint:   Gynecologic Exam (Needs pap smear)  History of Present Illness:   Caitlyn Patterson is a 60 y.o. G1P0010 PM female being seen today for a routine well-woman exam.  Today she notes no acute complaints or concerns  Last pap 2021- ASCUS, HPV neg   Patient's last menstrual period was 07/23/2013. Denies issues with her menses  Last pap 2021- ASCUS, HPV neg.  Last mammogram: 05/2021. Last colonoscopy: 11/2019  Depression screen [facility] 2/9 08/01/2021 07/16/2021 06/20/2021 12/12/2020 07/25/2020  Decreased Interest 0 0 0 0 0  Down, Depressed, Hopeless 0 0 0 0 0  PHQ - 2 Score 0 0 0 0 0  Altered sleeping 0 - - - -  Tired, decreased energy 0 - - - -  Change in appetite 0 - - - -  Feeling bad or failure about yourself  0 - - - -  Trouble concentrating 0 - - - -  Moving slowly or fidgety/restless 0 - - - -  Suicidal thoughts 0 - - - -  PHQ-9 Score 0 - - - -      Review of Systems:   Pertinent items are noted in HPI Denies any headaches, blurred vision, fatigue, shortness of breath, chest pain, abdominal pain, bowel movements, urination, or intercourse unless otherwise stated above.  Pertinent History Reviewed:  Reviewed past medical,surgical, social and family history.  Reviewed problem list, medications and allergies. Physical Assessment:   Vitals:   08/01/21 1558  BP: 132/86  Pulse: 76  Weight: 187 lb 12.8 oz (85.2 kg)  Height: '5\' 4"'$  (1.626 m)  Body mass index is 32.24 kg/m.        Physical Examination:   General appearance - well appearing, and in no distress  Mental status - alert, oriented to person, place, and time  Psych:  She has a normal mood and affect  Skin - warm and dry, normal color, no suspicious lesions noted  Chest - effort normal, all lung fields clear to auscultation bilaterally  Heart - normal rate and regular rhythm  Neck:  midline trachea, no thyromegaly or  nodules  Breasts - breasts appear normal, no suspicious masses, no skin or nipple changes or  axillary nodes  Abdomen - soft, nontender, nondistended, no masses or organomegaly  Pelvic - VULVA: normal appearing vulva with no masses, tenderness or lesions  VAGINA: normal appearing vagina with normal color and discharge, no lesions  CERVIX: normal appearing cervix without discharge or lesions, no CMT  Pap not indicated  UTERUS: uterus is felt to be normal size, shape, consistency and nontender   ADNEXA: No adnexal masses or tenderness noted.  Extremities:  No swelling or varicosities noted  Chaperone: Celene Squibb     Assessment & Plan:  1) GYN exam, prior ASCUS, HPV neg Reviewed ASCCP guidelines, pap not indicated, next cytology 2024 Other screening exams up to date F/U prn   Meds: No orders of the defined types were placed in this encounter.   Follow-up: No follow-ups on file.   Janyth Pupa, DO Attending [place], [facility] for [employer], [place]

## 2021-12-25 LAB — LIPID PANEL
Chol/HDL Ratio: 3.1 ratio (ref 0.0–4.4)
Cholesterol, Total: 197 mg/dL (ref 100–199)
HDL: 64 mg/dL (ref >39)
LDL Chol Calc (NIH): 123 mg/dL — ABNORMAL HIGH (ref 0–99)
Triglycerides: 53 mg/dL (ref 0–149)
VLDL Cholesterol Cal: 10 mg/dL (ref 5–40)

## 2021-12-25 LAB — CMP14+EGFR
ALT: 14 IU/L (ref 0–32)
AST: 20 IU/L (ref 0–40)
Albumin/Globulin Ratio: 1.6 (ref 1.2–2.2)
Albumin: 4.4 g/dL (ref 3.8–4.9)
Alkaline Phosphatase: 50 IU/L (ref 44–121)
BUN/Creatinine Ratio: 14 (ref 9–23)
BUN: 11 mg/dL (ref 6–24)
Bilirubin Total: 0.5 mg/dL (ref 0.0–1.2)
CO2: 23 mmol/L (ref 20–29)
Calcium: 9.4 mg/dL (ref 8.7–10.2)
Chloride: 100 mmol/L (ref 96–106)
Creatinine, Ser: 0.8 mg/dL (ref 0.57–1.00)
Globulin, Total: 2.8 g/dL (ref 1.5–4.5)
Glucose: 89 mg/dL (ref 70–99)
Potassium: 4 mmol/L (ref 3.5–5.2)
Sodium: 138 mmol/L (ref 134–144)
Total Protein: 7.2 g/dL (ref 6.0–8.5)
eGFR: 85 mL/min/{1.73_m2} (ref >59)

## 2021-12-25 LAB — CBC
Hematocrit: 38.4 % (ref 34.0–46.6)
Hemoglobin: 13 g/dL (ref 11.1–15.9)
MCH: 32.2 pg (ref 26.6–33.0)
MCHC: 33.9 g/dL (ref 31.5–35.7)
MCV: 95 fL (ref 79–97)
Platelets: 162 10*3/uL (ref 150–450)
RBC: 4.04 x10E6/uL (ref 3.77–5.28)
RDW: 11.7 % (ref 11.7–15.4)
WBC: 6.3 10*3/uL (ref 3.4–10.8)

## 2021-12-25 LAB — TSH: TSH: 4.66 u[IU]/mL — ABNORMAL HIGH (ref 0.450–4.500)

## 2021-12-25 LAB — VITAMIN D 25 HYDROXY (VIT D DEFICIENCY, FRACTURES): Vit D, 25-Hydroxy: 26.9 ng/mL — ABNORMAL LOW (ref 30.0–100.0)

## 2021-12-28 ENCOUNTER — Other Ambulatory Visit: Payer: Self-pay | Admitting: Family Medicine

## 2021-12-28 LAB — SPECIMEN STATUS REPORT

## 2021-12-28 LAB — T4, FREE: Free T4: 1.22 ng/dL (ref 0.82–1.77)

## 2021-12-28 LAB — T3, FREE: T3, Free: 3 pg/mL (ref 2.0–4.4)

## 2022-01-03 ENCOUNTER — Other Ambulatory Visit: Payer: Self-pay

## 2022-01-03 ENCOUNTER — Ambulatory Visit (INDEPENDENT_AMBULATORY_CARE_PROVIDER_SITE_OTHER): Payer: 59 | Admitting: Family Medicine

## 2022-01-03 ENCOUNTER — Ambulatory Visit: Payer: 59 | Admitting: Family Medicine

## 2022-01-03 VITALS — BP 112/79

## 2022-01-03 DIAGNOSIS — I10 Essential (primary) hypertension: Secondary | ICD-10-CM

## 2022-01-03 DIAGNOSIS — E559 Vitamin D deficiency, unspecified: Secondary | ICD-10-CM | POA: Diagnosis not present

## 2022-01-03 DIAGNOSIS — E669 Obesity, unspecified: Secondary | ICD-10-CM

## 2022-01-03 DIAGNOSIS — E785 Hyperlipidemia, unspecified: Secondary | ICD-10-CM | POA: Diagnosis not present

## 2022-01-03 NOTE — Patient Instructions (Signed)
F/U in mid October , flu vaccine at visit   It is important that you exercise regularly at least 30 minutes 5 times a week. If you develop chest pain, have severe difficulty breathing, or feel very tired, stop exercising immediately and seek medical attention   Please commit to calcium with Vit D for bone health   Think about what you will eat, plan ahead. Choose " clean, green, fresh or frozen" over canned, processed or packaged foods which are more sugary, salty and fatty. 70 to 75% of food eaten should be vegetables and fruit. Three meals at set times with snacks allowed between meals, but they must be fruit or vegetables. Aim to eat over a 12 hour period , example 7 am to 7 pm, and STOP after  your last meal of the day. Drink water,generally about 64 ounces per day, no other drink is as healthy. Fruit juice is best enjoyed in a healthy way, by EATING the fruit.   Fasting lipid, chem 7 and EGFr, tSH 5 days before visit  Thanks for choosing [facility], we consider it a privelige to serve you.

## 2022-01-03 NOTE — Progress Notes (Signed)
Virtual Visit via Telephone Note  I connected with Caitlyn Patterson on 01/03/22 at 11:40 AM EST by telephone and verified that I am speaking with the correct person using two identifiers.  Location: Patient: home Provider: office   I discussed the limitations, risks, security and privacy concerns of performing an evaluation and management service by telephone and the availability of in person appointments. I also discussed with the patient that there may be a patient responsible charge related to this service. The patient expressed understanding and agreed to proceed.   History of Present Illness:  Tele visit to f/u chronic health problems. States she is doing well and has no concerns. Reports taking medication as prescribed with no adverse s/e Has paperwork from her insurance through her job, which needs to be competed re labs reportedly, she will drop this off this pm Observations/Objective: BP 112/79    LMP 07/23/2013  Good communication with no confusion and intact memory. Alert and oriented x 3 No signs of respiratory distress during speech    Assessment and Plan: Essential hypertension DASH diet and commitment to daily physical activity for a minimum of 30 minutes discussed and encouraged, as a part of hypertension management. The importance of attaining a healthy weight is also discussed.  BP/Weight 01/03/2022 08/01/2021 06/20/2021 12/12/2020 07/25/2020 02/08/5572 12/20/252  Systolic BP 270 623 762 831 517 616 073  Diastolic BP 79 86 84 72 86 78 72  Wt. (Lbs) - 187.8 188 200.12 199.4 202 195  BMI - 32.24 31.28 33.3 33.18 33.61 32.45     If need y updated BP on form will need to be checked when she brings form as well as a weight obtained that is current  Hyperlipidemia LDL goal <100 Hyperlipidemia:Low fat diet discussed and encouraged.   Lipid Panel  Lab Results  Component Value Date   CHOL 197 12/24/2021   HDL 64 12/24/2021   LDLCALC 123 (H) 12/24/2021   TRIG 53  12/24/2021   CHOLHDL 3.1 12/24/2021     Need sto reduce fat in diet  Obesity (BMI 30.0-34.9)  Patient re-educated about  the importance of commitment to a  minimum of 150 minutes of exercise per week as able.  The importance of healthy food choices with portion control discussed, as well as eating regularly and within a 12 hour window most days. The need to choose "clean , green" food 50 to 75% of the time is discussed, as well as to make water the primary drink and set a goal of 64 ounces water daily.    Weight /BMI 08/01/2021 06/20/2021 12/12/2020  WEIGHT 187 lb 12.8 oz 188 lb 200 lb 1.9 oz  HEIGHT 5\' 4"  5\' 5"  5\' 5"   BMI 32.24 kg/m2 31.28 kg/m2 33.3 kg/m2       Follow Up Instructions:    I discussed the assessment and treatment plan with the patient. The patient was provided an opportunity to ask questions and all were answered. The patient agreed with the plan and demonstrated an understanding of the instructions.   The patient was advised to call back or seek an in-person evaluation if the symptoms worsen or if the condition fails to improve as anticipated.  I provided 12 minutes of non-face-to-face time during this encounter.   Tula Nakayama, MD

## 2022-01-06 ENCOUNTER — Encounter: Payer: Self-pay | Admitting: Family Medicine

## 2022-01-06 NOTE — Assessment & Plan Note (Signed)
°  Patient re-educated about  the importance of commitment to a  minimum of 150 minutes of exercise per week as able.  The importance of healthy food choices with portion control discussed, as well as eating regularly and within a 12 hour window most days. The need to choose "clean , green" food 50 to 75% of the time is discussed, as well as to make water the primary drink and set a goal of 64 ounces water daily.    Weight /BMI 08/01/2021 06/20/2021 12/12/2020  WEIGHT 187 lb 12.8 oz 188 lb 200 lb 1.9 oz  HEIGHT 5\' 4"  5\' 5"  5\' 5"   BMI 32.24 kg/m2 31.28 kg/m2 33.3 kg/m2

## 2022-01-06 NOTE — Assessment & Plan Note (Signed)
Hyperlipidemia:Low fat diet discussed and encouraged.   Lipid Panel  Lab Results  Component Value Date   CHOL 197 12/24/2021   HDL 64 12/24/2021   LDLCALC 123 (H) 12/24/2021   TRIG 53 12/24/2021   CHOLHDL 3.1 12/24/2021     Need sto reduce fat in diet

## 2022-01-06 NOTE — Assessment & Plan Note (Signed)
DASH diet and commitment to daily physical activity for a minimum of 30 minutes discussed and encouraged, as a part of hypertension management. The importance of attaining a healthy weight is also discussed.  BP/Weight 01/03/2022 08/01/2021 06/20/2021 12/12/2020 07/25/2020 4/69/6295 12/26/4130  Systolic BP 440 102 725 366 440 347 425  Diastolic BP 79 86 84 72 86 78 72  Wt. (Lbs) - 187.8 188 200.12 199.4 202 195  BMI - 32.24 31.28 33.3 33.18 33.61 32.45     If need y updated BP on form will need to be checked when she brings form as well as a weight obtained that is current

## 2022-01-30 ENCOUNTER — Ambulatory Visit: Payer: 59 | Admitting: *Deleted

## 2022-01-30 ENCOUNTER — Other Ambulatory Visit: Payer: Self-pay

## 2022-01-30 VITALS — BP 128/52 | HR 57 | Resp 16 | Ht 65.0 in | Wt 182.1 lb

## 2022-01-30 DIAGNOSIS — Z0130 Encounter for examination of blood pressure without abnormal findings: Secondary | ICD-10-CM

## 2022-06-25 ENCOUNTER — Encounter: Payer: Self-pay | Admitting: Nurse Practitioner

## 2022-06-25 ENCOUNTER — Ambulatory Visit (INDEPENDENT_AMBULATORY_CARE_PROVIDER_SITE_OTHER): Payer: 59 | Admitting: Nurse Practitioner

## 2022-06-25 DIAGNOSIS — J029 Acute pharyngitis, unspecified: Secondary | ICD-10-CM | POA: Diagnosis not present

## 2022-06-25 DIAGNOSIS — H6123 Impacted cerumen, bilateral: Secondary | ICD-10-CM | POA: Diagnosis not present

## 2022-06-25 LAB — POCT RAPID STREP A (OFFICE): Rapid Strep A Screen: NEGATIVE

## 2022-06-25 MED ORDER — CHLORASEPTIC 1.4 % MT LIQD: 1.0000 | OROMUCOSAL | 0 refills | Status: DC | PRN

## 2022-06-25 NOTE — Assessment & Plan Note (Signed)
Bilateral ear lavage done in th office today , patient stated that she hears better after the procedure.

## 2022-06-25 NOTE — Patient Instructions (Addendum)
  We will get back to you about your test result.    It is important that you exercise regularly at least 30 minutes 5 times a week.  Think about what you will eat, plan ahead. Choose " clean, green, fresh or frozen" over canned, processed or packaged foods which are more sugary, salty and fatty. 70 to 75% of food eaten should be vegetables and fruit. Three meals at set times with snacks allowed between meals, but they must be fruit or vegetables. Aim to eat over a 12 hour period , example 7 am to 7 pm, and STOP after  your last meal of the day. Drink water,generally about 64 ounces per day, no other drink is as healthy. Fruit juice is best enjoyed in a healthy way, by EATING the fruit.  Thanks for choosing [facility], we consider it a privelige to serve you.

## 2022-06-25 NOTE — Assessment & Plan Note (Addendum)
Strep test negative Sore throat mist likely viral in origin  Chloraseptic spray as needed Take tylenol as needed for pain Encouraged to drink warm water, warm fluids, , gargle with salt water Will refer to ENT if condition does not improve

## 2022-06-25 NOTE — Progress Notes (Signed)
Strep test was negative , use chloraseptic throat spray as needed, tylenol '650mg'$  as needed for throat pain. Patient should notify the office if her symptoms does not improve , will plan to refer to ENT if needed.

## 2022-06-25 NOTE — Progress Notes (Signed)
   Caitlyn Patterson     MRN: [id number]      DOB: [birth year]   HPI Caitlyn Patterson with past medical history of HTN, hyperlipidemia, obesity  is here for c/o dry cough and sore throat for over a week, took covid test a week ago , test was negative, she can hardly swallow, cough is mostly at night. Denies fever , chills. She has taken throat lozenges, salt water gargle, vick vapour rub on warm towel but no improvement.       ROS Denies recent fever or chills. Denies sinus pressure, nasal congestion, ear pain or sore throat. Denies chest congestion, productive cough or wheezing. Denies chest pains, palpitations and leg swelling Denies abdominal pain, nausea, vomiting,diarrhea or constipation.   Denies headaches, seizures, numbness, or tingling. Denies depression, anxiety or insomnia. Denies skin break down or rash.   PE  BP 135/84 (BP Location: Right Arm, Patient Position: Sitting, Cuff Size: Normal)   Pulse 61   Ht '5\' 5"'$  (1.651 m)   Wt 190 lb (86.2 kg)   LMP 07/23/2013   SpO2 95%   BMI 31.62 kg/m   Patient alert and oriented and in no cardiopulmonary distress.  HEENT: No facial asymmetry, EOMI,     Neck supple , throat appears mildy red, oral cavity moist and pink, no exudates noted. Has bilateral ear wax impaction . No lymphadenopathy.  Chest: Clear to auscultation bilaterally.  CVS: S1, S2 no murmurs, no S3.Regular rate.  ABD: Soft non tender.   Ext: No edema  MS: Adequate ROM spine, shoulders, hips and knees.  Psych: Good eye contact, normal affect. Memory intact not anxious or depressed appearing.  CNS: CN 2-12 intact, power,  normal throughout.no focal deficits noted.   Assessment & Plan  Pharyngitis Strep test negative Sore throat mist likely viral in origin  Chloraseptic spray as needed Take tylenol as needed for pain Encouraged to drink warm water, warm fluids, , gargle with salt water Will refer to ENT if condition does not improve  Bilateral impacted  cerumen Bilateral ear lavage done in th office today , patient stated that she hears better after the procedure.

## 2022-07-02 ENCOUNTER — Other Ambulatory Visit (HOSPITAL_COMMUNITY): Payer: Self-pay | Admitting: Family Medicine

## 2022-07-02 DIAGNOSIS — Z1231 Encounter for screening mammogram for malignant neoplasm of breast: Secondary | ICD-10-CM

## 2022-07-08 ENCOUNTER — Ambulatory Visit (HOSPITAL_COMMUNITY)
Admission: RE | Admit: 2022-07-08 | Discharge: 2022-07-08 | Disposition: A | Discharge status: Home / Self Care | Payer: 59 | Source: Ambulatory Visit | Attending: Family Medicine | Admitting: Family Medicine

## 2022-07-08 DIAGNOSIS — Z1231 Encounter for screening mammogram for malignant neoplasm of breast: Secondary | ICD-10-CM | POA: Insufficient documentation

## 2022-08-22 ENCOUNTER — Ambulatory Visit: Payer: 59 | Admitting: Family Medicine

## 2022-09-12 ENCOUNTER — Ambulatory Visit (INDEPENDENT_AMBULATORY_CARE_PROVIDER_SITE_OTHER): Payer: 59 | Admitting: Family Medicine

## 2022-09-12 ENCOUNTER — Encounter: Payer: Self-pay | Admitting: Family Medicine

## 2022-09-12 VITALS — BP 130/82 | HR 66 | Ht 65.0 in | Wt 188.1 lb

## 2022-09-12 DIAGNOSIS — E669 Obesity, unspecified: Secondary | ICD-10-CM | POA: Diagnosis not present

## 2022-09-12 DIAGNOSIS — E559 Vitamin D deficiency, unspecified: Secondary | ICD-10-CM

## 2022-09-12 DIAGNOSIS — Z01419 Encounter for gynecological examination (general) (routine) without abnormal findings: Secondary | ICD-10-CM

## 2022-09-12 DIAGNOSIS — E785 Hyperlipidemia, unspecified: Secondary | ICD-10-CM | POA: Diagnosis not present

## 2022-09-12 DIAGNOSIS — E66811 Obesity, class 1: Secondary | ICD-10-CM

## 2022-09-12 DIAGNOSIS — I10 Essential (primary) hypertension: Secondary | ICD-10-CM

## 2022-09-12 DIAGNOSIS — R87610 Atypical squamous cells of undetermined significance on cytologic smear of cervix (ASC-US): Secondary | ICD-10-CM

## 2022-09-12 NOTE — Patient Instructions (Addendum)
F/U in 6 months, call if you need me sooner  Congrats on great healthy habits, work  on exercise commitment please  No med changes  Increase calcium 600 mg to two daily  Increase Clean, Green eating and remember that the only healthy way to eat sweet is in fruit  Fasting cBC, lipid, cmp and eGFr, tSH and vit D 2nd week in January  Weight loss goal of 5 to 8  pounds in next 6 months  Need pap in January , pls schedule appt  Thanks for choosing [facility], we consider it a privelige to serve you.

## 2022-09-15 ENCOUNTER — Encounter: Payer: Self-pay | Admitting: Family Medicine

## 2022-09-15 NOTE — Assessment & Plan Note (Signed)
Hyperlipidemia:Low fat diet discussed and encouraged.   Lipid Panel  Lab Results  Component Value Date   CHOL 197 12/24/2021   HDL 64 12/24/2021   LDLCALC 123 (H) 12/24/2021   TRIG 53 12/24/2021   CHOLHDL 3.1 12/24/2021     Updated lab needed at/ before next visit.

## 2022-09-15 NOTE — Progress Notes (Signed)
Caitlyn Patterson     MRN: [id number]      DOB: October 15, 1961   HPI [name] is here for follow up and re-evaluation of chronic medical conditions, medication management and review of any available recent lab and radiology data.  Preventive health is updated, specifically  Cancer screening and Immunization.   Questions or concerns regarding consultations or procedures which the PT has had in the interim are  addressed. The PT denies any adverse reactions to current medications since the last visit.  iNTERMITTENT LEG PAIN X 3 MONTHS ROS Denies recent fever or chills. Denies sinus pressure, nasal congestion, ear pain or sore throat. Denies chest congestion, productive cough or wheezing. Denies chest pains, palpitations and leg swelling Denies abdominal pain, nausea, vomiting,diarrhea or constipation.   Denies dysuria, frequency, hesitancy or incontinence. . Denies depression, anxiety or insomnia. Denies skin break down or rash.   PE  BP 130/82   Pulse 66   Ht '5\' 5"'$  (1.651 m)   Wt 188 lb 1.3 oz (85.3 kg)   LMP 07/23/2013   SpO2 98%   BMI 31.30 kg/m   Patient alert and oriented and in no cardiopulmonary distress.  HEENT: No facial asymmetry, EOMI,     Neck supple .  Chest: Clear to auscultation bilaterally.  CVS: S1, S2 no murmurs, no S3.Regular rate.  ABD: Soft non tender.   Ext: No edema  MS: Adequate ROM spine, shoulders, hips and knees.  Skin: Intact, no ulcerations or rash noted.  Psych: Good eye contact, normal affect. Memory intact not anxious or depressed appearing.  CNS: CN 2-12 intact, power,  normal throughout.no focal deficits noted.   Assessment & Plan  Essential hypertension Controlled, no change in medication DASH diet and commitment to daily physical activity for a minimum of 30 minutes discussed and encouraged, as a part of hypertension management. The importance of attaining a healthy weight is also discussed.     09/12/2022    9:55 AM  09/12/2022    9:27 AM 06/25/2022   12:44 PM 01/30/2022    8:35 AM 01/03/2022   11:02 AM 08/01/2021    3:58 PM 06/20/2021    3:11 PM  BP/Weight  Systolic BP 235 361 443 154 008 676 195  Diastolic BP 82 73 84 52 79 86 84  Wt. (Lbs)  188.08 190 182.12  187.8 188  BMI  31.3 kg/m2 31.62 kg/m2 30.31 kg/m2  32.24 kg/m2 31.28 kg/m2       Hyperlipidemia Hyperlipidemia:Low fat diet discussed and encouraged.   Lipid Panel  Lab Results  Component Value Date   CHOL 197 12/24/2021   HDL 64 12/24/2021   LDLCALC 123 (H) 12/24/2021   TRIG 53 12/24/2021   CHOLHDL 3.1 12/24/2021     Updated lab needed at/ before next visit.   Obesity (BMI 30.0-34.9)  Patient re-educated about  the importance of commitment to a  minimum of 150 minutes of exercise per week as able.  The importance of healthy food choices with portion control discussed, as well as eating regularly and within a 12 hour window most days. The need to choose "clean , green" food 50 to 75% of the time is discussed, as well as to make water the primary drink and set a goal of 64 ounces water daily.       09/12/2022    9:27 AM 06/25/2022   12:44 PM 01/30/2022    8:35 AM  Weight /BMI  Weight 188 lb 1.3 oz 190  lb 182 lb 1.9 oz  Height '5\' 5"'$  (1.651 m) '5\' 5"'$  (1.651 m) '5\' 5"'$  (1.651 m)  BMI 31.3 kg/m2 31.62 kg/m2 30.31 kg/m2      Vitamin D deficiency Updated lab needed at/ before next visit.

## 2022-09-15 NOTE — Assessment & Plan Note (Signed)
Controlled, no change in medication DASH diet and commitment to daily physical activity for a minimum of 30 minutes discussed and encouraged, as a part of hypertension management. The importance of attaining a healthy weight is also discussed.     09/12/2022    9:55 AM 09/12/2022    9:27 AM 06/25/2022   12:44 PM 01/30/2022    8:35 AM 01/03/2022   11:02 AM 08/01/2021    3:58 PM 06/20/2021    3:11 PM  BP/Weight  Systolic BP 784 696 295 284 132 440 102  Diastolic BP 82 73 84 52 79 86 84  Wt. (Lbs)  188.08 190 182.12  187.8 188  BMI  31.3 kg/m2 31.62 kg/m2 30.31 kg/m2  32.24 kg/m2 31.28 kg/m2

## 2022-09-15 NOTE — Assessment & Plan Note (Signed)
Updated lab needed at/ before next visit.   

## 2022-09-15 NOTE — Assessment & Plan Note (Signed)
  Patient re-educated about  the importance of commitment to a  minimum of 150 minutes of exercise per week as able.  The importance of healthy food choices with portion control discussed, as well as eating regularly and within a 12 hour window most days. The need to choose "clean , green" food 50 to 75% of the time is discussed, as well as to make water the primary drink and set a goal of 64 ounces water daily.       09/12/2022    9:27 AM 06/25/2022   12:44 PM 01/30/2022    8:35 AM  Weight /BMI  Weight 188 lb 1.3 oz 190 lb 182 lb 1.9 oz  Height '5\' 5"'$  (1.651 m) '5\' 5"'$  (1.651 m) '5\' 5"'$  (1.651 m)  BMI 31.3 kg/m2 31.62 kg/m2 30.31 kg/m2

## 2022-09-26 ENCOUNTER — Telehealth: Payer: Self-pay | Admitting: Family Medicine

## 2022-09-26 NOTE — Telephone Encounter (Signed)
Spoke with pt she states that her last pap was 01/2020 spoke with emma advised of this she will call to schedule

## 2022-09-26 NOTE — Telephone Encounter (Signed)
Terrence Dupont called [facility] [phone] received referral asked if patient has had a paps smear since 2021? Please return call.

## 2022-11-28 ENCOUNTER — Other Ambulatory Visit: Payer: Self-pay | Admitting: Family Medicine

## 2022-12-06 ENCOUNTER — Ambulatory Visit: Payer: 59 | Admitting: Family Medicine

## 2022-12-14 LAB — CMP14+EGFR
ALT: 15 IU/L (ref 0–32)
AST: 21 IU/L (ref 0–40)
Albumin/Globulin Ratio: 1.5 (ref 1.2–2.2)
Albumin: 4.4 g/dL (ref 3.8–4.9)
Alkaline Phosphatase: 55 IU/L (ref 44–121)
BUN/Creatinine Ratio: 23 (ref 9–23)
BUN: 16 mg/dL (ref 6–24)
Bilirubin Total: 0.4 mg/dL (ref 0.0–1.2)
CO2: 21 mmol/L (ref 20–29)
Calcium: 9.3 mg/dL (ref 8.7–10.2)
Chloride: 104 mmol/L (ref 96–106)
Creatinine, Ser: 0.69 mg/dL (ref 0.57–1.00)
Globulin, Total: 2.9 g/dL (ref 1.5–4.5)
Glucose: 89 mg/dL (ref 70–99)
Potassium: 4.2 mmol/L (ref 3.5–5.2)
Sodium: 142 mmol/L (ref 134–144)
Total Protein: 7.3 g/dL (ref 6.0–8.5)
eGFR: 100 mL/min/{1.73_m2} (ref >59)

## 2022-12-14 LAB — LIPID PANEL
Chol/HDL Ratio: 2.9 ratio (ref 0.0–4.4)
Cholesterol, Total: 193 mg/dL (ref 100–199)
HDL: 66 mg/dL (ref >39)
LDL Chol Calc (NIH): 115 mg/dL — ABNORMAL HIGH (ref 0–99)
Triglycerides: 62 mg/dL (ref 0–149)
VLDL Cholesterol Cal: 12 mg/dL (ref 5–40)

## 2022-12-14 LAB — CBC
Hematocrit: 38.1 % (ref 34.0–46.6)
Hemoglobin: 12.6 g/dL (ref 11.1–15.9)
MCH: 31.6 pg (ref 26.6–33.0)
MCHC: 33.1 g/dL (ref 31.5–35.7)
MCV: 96 fL (ref 79–97)
Platelets: 146 10*3/uL — ABNORMAL LOW (ref 150–450)
RBC: 3.99 x10E6/uL (ref 3.77–5.28)
RDW: 11.9 % (ref 11.7–15.4)
WBC: 5.6 10*3/uL (ref 3.4–10.8)

## 2022-12-14 LAB — TSH: TSH: 5.16 u[IU]/mL — ABNORMAL HIGH (ref 0.450–4.500)

## 2022-12-14 LAB — VITAMIN D 25 HYDROXY (VIT D DEFICIENCY, FRACTURES): Vit D, 25-Hydroxy: 26.8 ng/mL — ABNORMAL LOW (ref 30.0–100.0)

## 2022-12-16 ENCOUNTER — Other Ambulatory Visit: Payer: Self-pay | Admitting: Family Medicine

## 2022-12-16 DIAGNOSIS — R7989 Other specified abnormal findings of blood chemistry: Secondary | ICD-10-CM

## 2022-12-17 LAB — T4, FREE: Free T4: 1.08 ng/dL (ref 0.82–1.77)

## 2022-12-17 LAB — SPECIMEN STATUS REPORT

## 2022-12-17 LAB — T3, FREE: T3, Free: 3.2 pg/mL (ref 2.0–4.4)

## 2023-01-02 ENCOUNTER — Ambulatory Visit: Payer: 59 | Admitting: Family Medicine

## 2023-02-18 ENCOUNTER — Encounter: Payer: Self-pay | Admitting: Adult Health

## 2023-02-18 ENCOUNTER — Ambulatory Visit (INDEPENDENT_AMBULATORY_CARE_PROVIDER_SITE_OTHER): Payer: 59 | Admitting: Adult Health

## 2023-02-18 ENCOUNTER — Other Ambulatory Visit (HOSPITAL_COMMUNITY)
Admission: RE | Admit: 2023-02-18 | Discharge: 2023-02-18 | Disposition: A | Discharge status: Home / Self Care | Payer: 59 | Source: Ambulatory Visit | Attending: Adult Health | Admitting: Adult Health

## 2023-02-18 VITALS — BP 143/88 | HR 60 | Ht 65.0 in | Wt 191.5 lb

## 2023-02-18 DIAGNOSIS — Z1211 Encounter for screening for malignant neoplasm of colon: Secondary | ICD-10-CM | POA: Diagnosis not present

## 2023-02-18 DIAGNOSIS — R87610 Atypical squamous cells of undetermined significance on cytologic smear of cervix (ASC-US): Secondary | ICD-10-CM | POA: Diagnosis not present

## 2023-02-18 DIAGNOSIS — I10 Essential (primary) hypertension: Secondary | ICD-10-CM

## 2023-02-18 DIAGNOSIS — Z01419 Encounter for gynecological examination (general) (routine) without abnormal findings: Secondary | ICD-10-CM | POA: Insufficient documentation

## 2023-02-18 LAB — HEMOCCULT GUIAC POC 1CARD (OFFICE): Fecal Occult Blood, POC: NEGATIVE

## 2023-02-18 NOTE — Progress Notes (Signed)
Patient ID: Caitlyn Patterson, female   DOB: [birth year], 62 y.o.   MRN: [id number] History of Present Illness: Caitlyn Patterson is a 62 year old black female,married, PM in for a well woman gyn exam and pap.  PCP is Dr Moshe Cipro.   Current Medications, Allergies, Past Medical History, Past Surgical History, Family History and Social History were reviewed in [employer] record.     Review of Systems: Patient denies any headaches, hearing loss, fatigue, blurred vision, shortness of breath, chest pain, abdominal pain, problems with bowel movements, urination, or intercourse. No joint pain or mood swings.     Physical Exam:BP (!) 143/88 (BP Location: Right Arm, Patient Position: Sitting, Cuff Size: Normal)   Pulse 60   Ht 5\' 5"  (1.651 m)   Wt 191 lb 8 oz (86.9 kg)   LMP 07/23/2013   BMI 31.87 kg/m   General:  Well developed, well nourished, no acute distress Skin:  Warm and dry Neck:  Midline trachea, normal thyroid, good ROM, no lymphadenopathy Lungs; Clear to auscultation bilaterally Breast:  No dominant palpable mass, retraction, or nipple discharge Cardiovascular: Regular rate and rhythm Abdomen:  Soft, non tender, no hepatosplenomegaly Pelvic:  External genitalia is normal in appearance, no lesions.  The vagina is normal in appearance. Urethra has no lesions or masses. The cervix is smooth with stenotic os, pap with HR HPV genotyping performed.   Uterus is felt to be normal size, shape, and contour.  No adnexal masses or tenderness noted.Bladder is non tender, no masses felt. Rectal: Good sphincter tone, no polyps, or hemorrhoids felt.  Hemoccult negative. Extremities/musculoskeletal:  No swelling or varicosities noted, no clubbing or cyanosis Psych:  No mood changes, alert and cooperative,seems happy AA is 2 Fall risk is low    02/18/2023    1:33 PM 09/12/2022    9:27 AM 06/25/2022   12:46 PM  Depression screen PHQ 2/9  Decreased Interest 0 0 0  Down, Depressed,  Hopeless 0 0 0  PHQ - 2 Score 0 0 0  Altered sleeping 0    Tired, decreased energy 0    Change in appetite 0    Feeling bad or failure about yourself  0    Trouble concentrating 0    Moving slowly or fidgety/restless 0    Suicidal thoughts 0    PHQ-9 Score 0         02/18/2023    1:33 PM 08/01/2021    4:07 PM  GAD 7 : Generalized Anxiety Score  Nervous, Anxious, on Edge 0 0  Control/stop worrying 0 0  Worry too much - different things 1 0  Trouble relaxing 0 0  Restless 0 0  Easily annoyed or irritable 0 0  Afraid - awful might happen 0 0  Total GAD 7 Score 1 0      Upstream - 02/18/23 1336       Pregnancy Intention Screening   Does the patient want to become pregnant in the next year? N/A    Does the patient's partner want to become pregnant in the next year? N/A    Would the patient like to discuss contraceptive options today? N/A      Contraception Wrap Up   Current Method No Method - Other Reason   postmenopausal   Reason for No Current Contraceptive Method at Intake ([facility] Only) Other    End Method No Method - Other Reason   postmenopausal  Examination chaperoned by Levy Pupa LPN  Impression and Plan: 1. Encounter for gynecological examination with Papanicolaou smear of cervix Pap sent Pap and physical in 3 years Physical with PCP, next year Labs with PCP Mammogram was negative 07/08/22 Colonoscopy per Gi  - Cytology - PAP( [facility])  2. ASCUS of cervix with negative high risk HPV Pap sent  3. Encounter for screening fecal occult blood testing Hemoccult was negative  - POCT occult blood stool  4. Essential hypertension Take BP meds  Follow up with PCP She had ribs for Easter so BP mildly elevated today

## 2023-02-24 LAB — CYTOLOGY - PAP
Comment: NEGATIVE
Diagnosis: NEGATIVE
High risk HPV: NEGATIVE

## 2023-03-13 ENCOUNTER — Ambulatory Visit: Payer: 59 | Admitting: Family Medicine

## 2023-03-14 ENCOUNTER — Ambulatory Visit: Payer: 59 | Admitting: Family Medicine

## 2023-06-23 ENCOUNTER — Other Ambulatory Visit (HOSPITAL_COMMUNITY): Payer: Self-pay | Admitting: Family Medicine

## 2023-06-23 DIAGNOSIS — Z1231 Encounter for screening mammogram for malignant neoplasm of breast: Secondary | ICD-10-CM

## 2023-07-17 ENCOUNTER — Ambulatory Visit (HOSPITAL_COMMUNITY): Payer: 59

## 2023-08-06 ENCOUNTER — Ambulatory Visit (HOSPITAL_COMMUNITY)
Admission: RE | Admit: 2023-08-06 | Discharge: 2023-08-06 | Disposition: A | Discharge status: Home / Self Care | Payer: 59 | Source: Ambulatory Visit | Attending: Family Medicine | Admitting: Family Medicine

## 2023-08-06 DIAGNOSIS — Z1231 Encounter for screening mammogram for malignant neoplasm of breast: Secondary | ICD-10-CM | POA: Insufficient documentation

## 2023-10-14 ENCOUNTER — Ambulatory Visit (HOSPITAL_COMMUNITY)
Admission: RE | Admit: 2023-10-14 | Discharge: 2023-10-14 | Disposition: A | Discharge status: Home / Self Care | Payer: 59 | Source: Ambulatory Visit | Attending: Family Medicine | Admitting: Family Medicine

## 2023-10-14 ENCOUNTER — Encounter: Payer: Self-pay | Admitting: Family Medicine

## 2023-10-14 ENCOUNTER — Ambulatory Visit: Payer: 59 | Admitting: Family Medicine

## 2023-10-14 VITALS — BP 119/73 | HR 60 | Ht 65.0 in | Wt 191.1 lb

## 2023-10-14 DIAGNOSIS — E559 Vitamin D deficiency, unspecified: Secondary | ICD-10-CM

## 2023-10-14 DIAGNOSIS — R7989 Other specified abnormal findings of blood chemistry: Secondary | ICD-10-CM

## 2023-10-14 DIAGNOSIS — I10 Essential (primary) hypertension: Secondary | ICD-10-CM | POA: Diagnosis not present

## 2023-10-14 DIAGNOSIS — K5790 Diverticulosis of intestine, part unspecified, without perforation or abscess without bleeding: Secondary | ICD-10-CM

## 2023-10-14 DIAGNOSIS — E785 Hyperlipidemia, unspecified: Secondary | ICD-10-CM

## 2023-10-14 DIAGNOSIS — M545 Low back pain, unspecified: Secondary | ICD-10-CM

## 2023-10-14 DIAGNOSIS — M549 Dorsalgia, unspecified: Secondary | ICD-10-CM | POA: Insufficient documentation

## 2023-10-14 DIAGNOSIS — M79641 Pain in right hand: Secondary | ICD-10-CM

## 2023-10-14 DIAGNOSIS — E66811 Obesity, class 1: Secondary | ICD-10-CM

## 2023-10-14 NOTE — Progress Notes (Signed)
Caitlyn Patterson     MRN: [id number]      DOB: [birth year]  Chief Complaint  Patient presents with   Follow-up    Lower back and stomach issues patient reports better now thinks it was diverticulitis     HPI Caitlyn Patterson is here for follow up and re-evaluation of chronic medical conditions, medication management and review of any available recent lab and radiology data.  Preventive health is updated, specifically  Cancer screening and Immunization.   Questions or concerns regarding consultations or procedures which the PT has had in the interim are  addressed. The PT denies any adverse reactions to current medications since the last visit.  Recent episode approx 3 weeks ago of lower back pain and lower intesinal / abdominal pain which she attributed to diverticulitis , no h/o fever, bloody stool or rectal bleeding,acute  symptoms have resolved but still c/o low back pain Painand tingling in right hand awakening her at times over past 4 to 6 months intermittently ROS Denies recent fever or chills. Denies sinus pressure, nasal congestion, ear pain or sore throat. Denies chest congestion, productive cough or wheezing. Denies chest pains, palpitations and leg swelling    Denies dysuria, frequency, hesitancy or incontinence.  Denies headaches, seizures, numbness, or tingling. Denies depression, anxiety or insomnia. Denies skin break down or rash.   PE  BP 119/73 (BP Location: Right Arm, Patient Position: Sitting, Cuff Size: Large)   Pulse 60   Ht 5\' 5"  (1.651 m)   Wt 191 lb 1.9 oz (86.7 kg)   LMP 07/23/2013   SpO2 98%   BMI 31.80 kg/m   Patient alert and oriented and in no cardiopulmonary distress.  HEENT: No facial asymmetry, EOMI,     Neck supple .  Chest: Clear to auscultation bilaterally.  CVS: S1, S2 no murmurs, no S3.Regular rate.  ABD: Soft non tender.   Ext: No edema  MS: Adequate ROM spine, shoulders, hips and knees.Pain on direct pressure of lower back  Skin:  Intact, no ulcerations or rash noted.  Psych: Good eye contact, normal affect. Memory intact not anxious or depressed appearing.  CNS: CN 2-12 intact, power,  normal throughout.no focal deficits noted.   Assessment & Plan  Back pain X ray lumbar spine, likley arthritis present and cause of pain Advised as neededtylenol, back execises and topical rubs  Essential hypertension Controlled, no change in medication DASH diet and commitment to daily physical activity for a minimum of 30 minutes discussed and encouraged, as a part of hypertension management. The importance of attaining a healthy weight is also discussed.     10/14/2023    9:06 AM 02/18/2023    1:52 PM 02/18/2023    1:34 PM 09/12/2022    9:55 AM 09/12/2022    9:27 AM 06/25/2022   12:44 PM 01/30/2022    8:35 AM  BP/Weight  Systolic BP 119 143 143 130 118 135 128  Diastolic BP 73 88 88 82 73 84 52  Wt. (Lbs) 191.12  191.5  188.08 190 182.12  BMI 31.8 kg/m2  31.87 kg/m2  31.3 kg/m2 31.62 kg/m2 30.31 kg/m2       Hyperlipidemia LDL goal <100 Hyperlipidemia:Low fat diet discussed and encouraged.   Lipid Panel  Lab Results  Component Value Date   CHOL 199 10/14/2023   HDL 66 10/14/2023   LDLCALC 124 (H) 10/14/2023   TRIG 46 10/14/2023   CHOLHDL 3.0 10/14/2023     Needs to reduce fat in  diet  Obesity (BMI 30.0-34.9)  Patient re-educated about  the importance of commitment to a  minimum of 150 minutes of exercise per week as able.  The importance of healthy food choices with portion control discussed, as well as eating regularly and within a 12 hour window most days. The need to choose "clean , green" food 50 to 75% of the time is discussed, as well as to make water the primary drink and set a goal of 64 ounces water daily.       10/14/2023    9:06 AM 02/18/2023    1:34 PM 09/12/2022    9:27 AM  Weight /BMI  Weight 191 lb 1.9 oz 191 lb 8 oz 188 lb 1.3 oz  Height 5\' 5"  (1.651 m) 5\' 5"  (1.651 m) 5\' 5"  (1.651  m)  BMI 31.8 kg/m2 31.87 kg/m2 31.3 kg/m2      Diverticulosis No current flare , history presents with not c/w acute flare   Right hand pain Early / mild carpal tunnel synd, brace for night iuse , cal with worsening symptoms for orhto eval  Abnormal TSH Update lab and obtain thyroid US  Vitamin D deficiency Updated lab needed at/ before next visit.

## 2023-10-14 NOTE — Patient Instructions (Addendum)
F/U in 6 months, call if you need me sooner  X-ray of your low back today at the hospital.  Your results will be sent to you via MyChart.  Continue your tea for back pain no medication is prescribed  Labs today CBC lipid CMP and EGFR TSH vitamin D and B12 level.and hBA1C  Tingling and pain in the right hand when asleep is likely due to early carpal tunnel syndrome.  You may get specific brace for this over-the-counter, call if symptoms worsen for referral to orthopedics.  Increase vegetable and fruit intake and reduce sweets and white starchy foods.  Continue and improve your exercise commitment to 5 days/week 30 minutes each time.  Best for the season and 2025.  Weight loss goal in the next 6 months is 6 to 10 pounds.  Thanks for choosing [facility], we consider it a privelige to serve you.

## 2023-10-15 ENCOUNTER — Other Ambulatory Visit: Payer: Self-pay

## 2023-10-15 DIAGNOSIS — R7989 Other specified abnormal findings of blood chemistry: Secondary | ICD-10-CM

## 2023-10-15 LAB — CMP14+EGFR
ALT: 11 [IU]/L (ref 0–32)
AST: 18 [IU]/L (ref 0–40)
Albumin: 4.4 g/dL (ref 3.8–4.9)
Alkaline Phosphatase: 56 [IU]/L (ref 44–121)
BUN/Creatinine Ratio: 19 (ref 12–28)
BUN: 16 mg/dL (ref 8–27)
Bilirubin Total: 0.3 mg/dL (ref 0.0–1.2)
CO2: 25 mmol/L (ref 20–29)
Calcium: 9.8 mg/dL (ref 8.7–10.3)
Chloride: 101 mmol/L (ref 96–106)
Creatinine, Ser: 0.84 mg/dL (ref 0.57–1.00)
Globulin, Total: 2.7 g/dL (ref 1.5–4.5)
Glucose: 85 mg/dL (ref 70–99)
Potassium: 4.3 mmol/L (ref 3.5–5.2)
Sodium: 139 mmol/L (ref 134–144)
Total Protein: 7.1 g/dL (ref 6.0–8.5)
eGFR: 80 mL/min/{1.73_m2} (ref >59)

## 2023-10-15 LAB — LIPID PANEL
Chol/HDL Ratio: 3 {ratio} (ref 0.0–4.4)
Cholesterol, Total: 199 mg/dL (ref 100–199)
HDL: 66 mg/dL (ref >39)
LDL Chol Calc (NIH): 124 mg/dL — ABNORMAL HIGH (ref 0–99)
Triglycerides: 46 mg/dL (ref 0–149)
VLDL Cholesterol Cal: 9 mg/dL (ref 5–40)

## 2023-10-15 LAB — CBC
Hematocrit: 38.5 % (ref 34.0–46.6)
Hemoglobin: 13.1 g/dL (ref 11.1–15.9)
MCH: 33 pg (ref 26.6–33.0)
MCHC: 34 g/dL (ref 31.5–35.7)
MCV: 97 fL (ref 79–97)
Platelets: 162 10*3/uL (ref 150–450)
RBC: 3.97 x10E6/uL (ref 3.77–5.28)
RDW: 11.9 % (ref 11.7–15.4)
WBC: 5.6 10*3/uL (ref 3.4–10.8)

## 2023-10-15 LAB — HEMOGLOBIN A1C
Est. average glucose Bld gHb Est-mCnc: 111 mg/dL
Hgb A1c MFr Bld: 5.5 % (ref 4.8–5.6)

## 2023-10-15 LAB — VITAMIN B12: Vitamin B-12: 492 pg/mL (ref 232–1245)

## 2023-10-15 LAB — TSH: TSH: 5 u[IU]/mL — ABNORMAL HIGH (ref 0.450–4.500)

## 2023-10-15 LAB — VITAMIN D 25 HYDROXY (VIT D DEFICIENCY, FRACTURES): Vit D, 25-Hydroxy: 35 ng/mL (ref 30.0–100.0)

## 2023-10-18 LAB — T4, FREE: Free T4: 1.11 ng/dL (ref 0.82–1.77)

## 2023-10-18 LAB — SPECIMEN STATUS REPORT

## 2023-10-18 LAB — T3, FREE: T3, Free: 3.3 pg/mL (ref 2.0–4.4)

## 2023-10-20 ENCOUNTER — Telehealth: Payer: Self-pay

## 2023-10-20 ENCOUNTER — Other Ambulatory Visit: Payer: Self-pay

## 2023-10-20 DIAGNOSIS — R7989 Other specified abnormal findings of blood chemistry: Secondary | ICD-10-CM

## 2023-10-20 NOTE — Telephone Encounter (Signed)
Copied from CRM #[id number]. Topic: Clinical - Medical Advice >> Oct 20, 2023 11:49 AM Caitlyn Patterson wrote: Reason for CRM: PT is returning a missed call to [facility] and would like her to call back again to discuss results and why PT needed to schedule and ultra sound after X-Ray

## 2023-10-22 ENCOUNTER — Ambulatory Visit (HOSPITAL_COMMUNITY): Payer: 59

## 2023-10-22 NOTE — Telephone Encounter (Signed)
Patient aware.

## 2023-10-23 ENCOUNTER — Ambulatory Visit (HOSPITAL_COMMUNITY)
Admission: RE | Admit: 2023-10-23 | Discharge: 2023-10-23 | Disposition: A | Discharge status: Home / Self Care | Payer: 59 | Source: Ambulatory Visit | Attending: Family Medicine | Admitting: Family Medicine

## 2023-10-23 DIAGNOSIS — R7989 Other specified abnormal findings of blood chemistry: Secondary | ICD-10-CM | POA: Insufficient documentation

## 2023-10-24 DIAGNOSIS — M79641 Pain in right hand: Secondary | ICD-10-CM | POA: Insufficient documentation

## 2023-10-24 NOTE — Assessment & Plan Note (Signed)
Hyperlipidemia:Low fat diet discussed and encouraged.   Lipid Panel  Lab Results  Component Value Date   CHOL 199 10/14/2023   HDL 66 10/14/2023   LDLCALC 124 (H) 10/14/2023   TRIG 46 10/14/2023   CHOLHDL 3.0 10/14/2023     Needs to reduce fat in diet

## 2023-10-24 NOTE — Assessment & Plan Note (Signed)
X ray lumbar spine, likley arthritis present and cause of pain Advised as neededtylenol, back execises and topical rubs

## 2023-10-24 NOTE — Assessment & Plan Note (Signed)
Controlled, no change in medication DASH diet and commitment to daily physical activity for a minimum of 30 minutes discussed and encouraged, as a part of hypertension management. The importance of attaining a healthy weight is also discussed.     10/14/2023    9:06 AM 02/18/2023    1:52 PM 02/18/2023    1:34 PM 09/12/2022    9:55 AM 09/12/2022    9:27 AM 06/25/2022   12:44 PM 01/30/2022    8:35 AM  BP/Weight  Systolic BP 119 143 143 130 118 135 128  Diastolic BP 73 88 88 82 73 84 52  Wt. (Lbs) 191.12  191.5  188.08 190 182.12  BMI 31.8 kg/m2  31.87 kg/m2  31.3 kg/m2 31.62 kg/m2 30.31 kg/m2

## 2023-10-24 NOTE — Assessment & Plan Note (Signed)
Update lab and obtain thyroid US

## 2023-10-24 NOTE — Assessment & Plan Note (Signed)
No current flare , history presents with not c/w acute flare

## 2023-10-24 NOTE — Assessment & Plan Note (Signed)
Early / mild carpal tunnel synd, brace for night iuse , cal with worsening symptoms for orhto eval

## 2023-10-24 NOTE — Assessment & Plan Note (Signed)
  Patient re-educated about  the importance of commitment to a  minimum of 150 minutes of exercise per week as able.  The importance of healthy food choices with portion control discussed, as well as eating regularly and within a 12 hour window most days. The need to choose "clean , green" food 50 to 75% of the time is discussed, as well as to make water the primary drink and set a goal of 64 ounces water daily.       10/14/2023    9:06 AM 02/18/2023    1:34 PM 09/12/2022    9:27 AM  Weight /BMI  Weight 191 lb 1.9 oz 191 lb 8 oz 188 lb 1.3 oz  Height 5\' 5"  (1.651 m) 5\' 5"  (1.651 m) 5\' 5"  (1.651 m)  BMI 31.8 kg/m2 31.87 kg/m2 31.3 kg/m2

## 2023-10-24 NOTE — Assessment & Plan Note (Signed)
Updated lab needed at/ before next visit.   

## 2023-11-04 ENCOUNTER — Other Ambulatory Visit: Payer: Self-pay | Admitting: Family Medicine

## 2024-02-06 ENCOUNTER — Ambulatory Visit (INDEPENDENT_AMBULATORY_CARE_PROVIDER_SITE_OTHER): Admitting: Internal Medicine

## 2024-02-06 VITALS — BP 99/65 | HR 69 | Ht 65.0 in | Wt 195.0 lb

## 2024-02-06 DIAGNOSIS — L272 Dermatitis due to ingested food: Secondary | ICD-10-CM | POA: Diagnosis not present

## 2024-02-06 MED ORDER — CLOBETASOL PROPIONATE 0.05 % EX CREA: 1.0000 | TOPICAL_CREAM | Freq: Two times a day (BID) | CUTANEOUS | 0 refills | Status: DC

## 2024-02-06 NOTE — Patient Instructions (Signed)
 Please apply Clobetasol over the rash area.  Please take Zyrtec once daily for allergic reaction for the next 1 week.

## 2024-02-06 NOTE — Progress Notes (Signed)
 Acute Office Visit  Subjective:    Patient ID: Caitlyn Patterson, female    DOB: Oct 10, 1961, 63 y.o.   MRN: [id number]  Chief Complaint  Patient presents with   Allergic Reaction    Pt reports sx of possible allergic reaction has a rash on right arm, a little on her abdomen and neck. Unsure what caused this, started a new protein shake on Monday, bumps started on Tuesday. Has applied chickweed salve.     HPI Patient is in today for evaluation of rash on her right forearm, upper chest wall and abdominal wall area near umbilicus since 02/03/24.  Denies itching or burning pain.  She has not tried antihistaminic yet.  She denies any new medicine, but has tried a new protein shake a day prior to her rash.  Denies lip swelling, dyspnea, dysphagia currently.  Her BP is borderline low, but denies any dizziness.  Reports that her BP usually stays around 120s/70s.  Past Medical History:  Diagnosis Date   BV (bacterial vaginosis)    Educated about COVID-19 virus infection 04/12/2019   Fibroid    Hematuria 09/20/2015   Hypertension    Menopause 07/15/2014   Pain with urination 09/20/2015   Rectocele 04/20/2013   Vaginal dryness 07/15/2014   Yeast vaginitis 08/06/2013    Past Surgical History:  Procedure Laterality Date   COLONOSCOPY  2010   Dr. Darrick Penna: normal, small internal hemorrhoids   COLONOSCOPY N/A 12/01/2019   Procedure: COLONOSCOPY;  Surgeon: West Bali, MD;  Location: [facility];  Service: Endoscopy;  Laterality: N/A;  12:00   NO PAST SURGERIES     POLYPECTOMY  12/01/2019   Procedure: POLYPECTOMY;  Surgeon: West Bali, MD;  Location: [facility];  Service: Endoscopy;;  transverse colon   varicose veins      Family History  Problem Relation Age of Onset   Cancer Maternal Grandmother        colon    Stroke Mother    Hypertension Mother    Cancer Maternal Aunt        breast   Pneumonia Father    Hypertension Brother    Hypertension Sister    Varicose Veins Sister     Diabetes Paternal Aunt    Colon polyps Neg Hx     Social History   Socioeconomic History   Marital status: Married    Spouse name: Not on file   Number of children: Not on file   Years of education: Not on file   Highest education level: Some college, no degree  Occupational History   Not on file  Tobacco Use   Smoking status: Never   Smokeless tobacco: Never  Vaping Use   Vaping status: Never Used  Substance and Sexual Activity   Alcohol use: Yes    Alcohol/week: 2.0 - 3.0 standard drinks of alcohol    Types: 1 - 2 Glasses of wine, 1 Shots of liquor per week    Comment: occ   Drug use: No   Sexual activity: Yes    Birth control/protection: Post-menopausal  Other Topics Concern   Not on file  Social History Narrative   Not on file   Social Drivers of Health   Financial Resource Strain: Low Risk  (02/06/2024)   Overall Financial Resource Strain (CARDIA)    Difficulty of Paying Living Expenses: Not hard at all  Food Insecurity: No Food Insecurity (02/06/2024)   Hunger Vital Sign    Worried About Running  Out of Food in the Last Year: Never true    Ran Out of Food in the Last Year: Never true  Transportation Needs: No Transportation Needs (02/06/2024)   PRAPARE - Administrator, Civil Service (Medical): No    Lack of Transportation (Non-Medical): No  Physical Activity: Insufficiently Active (02/06/2024)   Exercise Vital Sign    Days of Exercise per Week: 3 days    Minutes of Exercise per Session: 20 min  Stress: No Stress Concern Present (02/06/2024)   [employer] of Occupational Health - Occupational Stress Questionnaire    Feeling of Stress : Not at all  Social Connections: Socially Integrated (02/06/2024)   Social Connection and Isolation Panel [NHANES]    Frequency of Communication with Friends and Family: More than three times a week    Frequency of Social Gatherings with Friends and Family: Three times a week    Attends Religious Services: More  than 4 times per year    Active Member of Clubs or Organizations: Yes    Attends Banker Meetings: More than 4 times per year    Marital Status: Married  Catering manager Violence: Not At Risk (02/18/2023)   Humiliation, Afraid, Rape, and Kick questionnaire    Fear of Current or Ex-Partner: No    Emotionally Abused: No    Physically Abused: No    Sexually Abused: No    Outpatient Medications Prior to Visit  Medication Sig Dispense Refill   acetaminophen (TYLENOL) 500 MG tablet Take 1,000 mg by mouth every 6 (six) hours as needed (pain.).      amLODipine (NORVASC) 2.5 MG tablet TAKE 1 TABLET BY MOUTH DAILY 90 tablet 3   calcium-vitamin D (OSCAL WITH D) 500-200 MG-UNIT tablet Take 1 tablet by mouth.     UNABLE TO FIND Med Name: chickweed salve for rash     No facility-administered medications prior to visit.    Allergies  Allergen Reactions   Amoxicillin Rash    REACTION: bumps and red itchy rash Did it involve swelling of the face/tongue/throat, SOB, or low BP? No Did it involve sudden or severe rash/hives, skin peeling, or any reaction on the inside of your mouth or nose? Yes Did you need to seek medical attention at a hospital or doctor's office? No When did it last happen?  Early 2000s    If all above answers are "NO", may proceed with cephalosporin use.    Bactrim [Sulfamethoxazole-Trimethoprim] Rash   Ibuprofen Rash    Review of Systems  Constitutional:  Negative for chills and fever.  HENT:  Negative for congestion, sinus pressure, sinus pain and sore throat.   Eyes:  Negative for pain and discharge.  Respiratory:  Negative for cough and shortness of breath.   Cardiovascular:  Negative for chest pain and palpitations.  Gastrointestinal:  Negative for abdominal pain, constipation, diarrhea, nausea and vomiting.  Endocrine: Negative for polydipsia and polyuria.  Genitourinary:  Negative for dysuria and hematuria.  Musculoskeletal:  Negative for neck pain and  neck stiffness.  Skin:  Positive for rash.  Neurological:  Negative for dizziness and weakness.  Psychiatric/Behavioral:  Negative for agitation and behavioral problems.        Objective:    Physical Exam Vitals reviewed.  Constitutional:      General: She is not in acute distress.    Appearance: She is not diaphoretic.  HENT:     Head: Normocephalic and atraumatic.     Nose: Nose normal.  Mouth/Throat:     Mouth: Mucous membranes are moist.  Eyes:     General: No scleral icterus.    Extraocular Movements: Extraocular movements intact.  Cardiovascular:     Rate and Rhythm: Normal rate and regular rhythm.     Heart sounds: Normal heart sounds. No murmur heard. Pulmonary:     Breath sounds: Normal breath sounds. No wheezing or rales.  Musculoskeletal:     Cervical back: Neck supple. No tenderness.     Right lower leg: No edema.     Left lower leg: No edema.  Skin:    General: Skin is warm.     Findings: Rash (vesicular rash on erythematous bases on right forearm and besides umbilicus) present.  Neurological:     General: No focal deficit present.     Mental Status: She is alert and oriented to person, place, and time.  Psychiatric:        Mood and Affect: Mood normal.        Behavior: Behavior normal.     BP 99/65   Pulse 69   Ht 5\' 5"  (1.651 m)   Wt 195 lb (88.5 kg)   LMP 07/23/2013   SpO2 98%   BMI 32.45 kg/m  Wt Readings from Last 3 Encounters:  02/06/24 195 lb (88.5 kg)  10/14/23 191 lb 1.9 oz (86.7 kg)  02/18/23 191 lb 8 oz (86.9 kg)        Assessment & Plan:   Problem List Items Addressed This Visit       Musculoskeletal and Integument   Allergic dermatitis due ingested food - Primary   Unclear if related to protein shake intake, but denies any other new chemical exposure Advised to avoid protein shake for now Clobetasol cream for rash Zyrtec 10 mg once daily for 1 week Advised to contact if persistent, worsening or new rash       Relevant Medications   clobetasol cream (TEMOVATE) 0.05 %     Meds ordered this encounter  Medications   clobetasol cream (TEMOVATE) 0.05 %    Sig: Apply 1 Application topically 2 (two) times daily.    Dispense:  30 g    Refill:  0     Oretta Berkland Concha Se, MD

## 2024-02-06 NOTE — Assessment & Plan Note (Signed)
 Unclear if related to protein shake intake, but denies any other new chemical exposure Advised to avoid protein shake for now Clobetasol cream for rash Zyrtec 10 mg once daily for 1 week Advised to contact if persistent, worsening or new rash

## 2024-04-13 ENCOUNTER — Ambulatory Visit: Payer: 59 | Admitting: Family Medicine

## 2024-07-05 ENCOUNTER — Other Ambulatory Visit (HOSPITAL_COMMUNITY): Payer: Self-pay | Admitting: Family Medicine

## 2024-07-05 DIAGNOSIS — Z1231 Encounter for screening mammogram for malignant neoplasm of breast: Secondary | ICD-10-CM

## 2024-08-11 ENCOUNTER — Encounter (HOSPITAL_COMMUNITY): Payer: Self-pay

## 2024-08-11 ENCOUNTER — Ambulatory Visit (HOSPITAL_COMMUNITY)
Admission: RE | Admit: 2024-08-11 | Discharge: 2024-08-11 | Disposition: A | Discharge status: Home / Self Care | Source: Ambulatory Visit | Attending: Family Medicine | Admitting: Family Medicine

## 2024-08-11 DIAGNOSIS — Z1231 Encounter for screening mammogram for malignant neoplasm of breast: Secondary | ICD-10-CM | POA: Insufficient documentation

## 2024-09-24 ENCOUNTER — Ambulatory Visit: Payer: Self-pay | Admitting: Family Medicine

## 2024-10-10 ENCOUNTER — Other Ambulatory Visit: Payer: Self-pay | Admitting: Family Medicine

## 2024-10-12 ENCOUNTER — Ambulatory Visit: Payer: Self-pay | Admitting: Family Medicine

## 2024-12-21 ENCOUNTER — Encounter: Payer: Self-pay | Admitting: Family Medicine

## 2024-12-21 ENCOUNTER — Ambulatory Visit: Payer: Self-pay | Admitting: Family Medicine

## 2024-12-21 VITALS — BP 124/78 | HR 64 | Resp 16 | Ht 65.0 in | Wt 198.4 lb

## 2024-12-21 DIAGNOSIS — H938X1 Other specified disorders of right ear: Secondary | ICD-10-CM

## 2024-12-21 DIAGNOSIS — E66811 Obesity, class 1: Secondary | ICD-10-CM

## 2024-12-21 DIAGNOSIS — R7989 Other specified abnormal findings of blood chemistry: Secondary | ICD-10-CM

## 2024-12-21 DIAGNOSIS — E559 Vitamin D deficiency, unspecified: Secondary | ICD-10-CM

## 2024-12-21 DIAGNOSIS — I10 Essential (primary) hypertension: Secondary | ICD-10-CM | POA: Diagnosis not present

## 2024-12-21 DIAGNOSIS — Z23 Encounter for immunization: Secondary | ICD-10-CM | POA: Diagnosis not present

## 2024-12-21 DIAGNOSIS — R87610 Atypical squamous cells of undetermined significance on cytologic smear of cervix (ASC-US): Secondary | ICD-10-CM

## 2024-12-21 DIAGNOSIS — Z6833 Body mass index (BMI) 33.0-33.9, adult: Secondary | ICD-10-CM

## 2024-12-21 DIAGNOSIS — E785 Hyperlipidemia, unspecified: Secondary | ICD-10-CM

## 2024-12-21 DIAGNOSIS — Z1211 Encounter for screening for malignant neoplasm of colon: Secondary | ICD-10-CM

## 2024-12-21 NOTE — Patient Instructions (Addendum)
 Annual to be scheduled with new MD in August /September  Pap due in 2029  Mammogram due Sept 25 or after, please schedule 9 pt will schedule)  Increase oscal D to one twice daily for  bone health   Colonoscopy due in 2031  Nurse pls order cologuard   Pneumonia vaccine today  Covid vaccines are recommended, please get in next 1 to 2 weeks  Fasting CBC, lipid,cmp and eGFR,  tSH, and bVit d end Next week or the following 2 weeks  You are referred for US  of neck arteries , you will be contacted by Radiology  It is important that you exercise regularly at least 30 minutes 5 times a week. If you develop chest pain, have severe difficulty breathing, or feel very tired, stop exercising immediately and seek medical attention   Think about what you will eat, plan ahead. Choose  clean, green, fresh or frozen over canned, processed or packaged foods which are more sugary, salty and fatty. 70 to 75% of food eaten should be vegetables and fruit. Three meals at set times with snacks allowed between meals, but they must be fruit or vegetables. Aim to eat over a 12 hour period , example 7 am to 7 pm, and STOP after  your last meal of the day. Drink water,generally about 64 ounces per day, no other drink is as healthy. Fruit juice is best enjoyed in a healthy way, by EATING the fruit.

## 2024-12-22 ENCOUNTER — Encounter: Payer: Self-pay | Admitting: Family Medicine

## 2024-12-22 DIAGNOSIS — H938X1 Other specified disorders of right ear: Secondary | ICD-10-CM | POA: Insufficient documentation

## 2024-12-22 DIAGNOSIS — Z23 Encounter for immunization: Secondary | ICD-10-CM | POA: Insufficient documentation

## 2024-12-22 DIAGNOSIS — Z1211 Encounter for screening for malignant neoplasm of colon: Secondary | ICD-10-CM | POA: Insufficient documentation

## 2024-12-22 NOTE — Progress Notes (Signed)
 "  Caitlyn Patterson     MRN: [id number]      DOB: Jul 24, 1961  Chief Complaint  Patient presents with   Hypertension    Follow up    Ear Fullness    Pt complains of ear fullness and pressure x1 week. Denies pain or dizziness. Pt describes it as a throbbing sensation     HPI Caitlyn Patterson is here for follow up and re-evaluation of chronic medical conditions, medication management and review of any available recent lab and radiology data.  Preventive health is updated, specifically  Cancer screening and Immunization.   Questions or concerns regarding consultations or procedures which the PT has had in the interim are  addressed. The PT denies any adverse reactions to current medications since the last visit.  C/o intermittent pulsations in her right ear over the past 2 to 3 months ROS Denies recent fever or chills. Denies sinus pressure, nasal congestion, ear pain or sore throat. Denies chest congestion, productive cough or wheezing. Denies chest pains, palpitations and leg swelling Denies abdominal pain, nausea, vomiting,diarrhea or constipation.   Denies dysuria, frequency, hesitancy or incontinence. Denies joint pain, swelling and limitation in mobility. Denies headaches, seizures, numbness, or tingling. Denies depression, anxiety or insomnia. Denies skin break down or rash.   PE  BP 124/78   Pulse 64   Resp 16   Ht 5' 5 (1.651 m)   Wt 198 lb 6.4 oz (90 kg)   LMP 07/23/2013   SpO2 93%   BMI 33.02 kg/m   Patient alert and oriented and in no cardiopulmonary distress.  HEENT: No facial asymmetry, EOMI,     Neck supple .Buit bilateral  Chest: Clear to auscultation bilaterally.  CVS: S1, S2 no murmurs, no S3.Regular rate.  ABD: Soft non tender.   Ext: No edema  MS: Adequate ROM spine, shoulders, hips and knees.  Skin: Intact, no ulcerations or rash noted.  Psych: Good eye contact, normal affect. Memory intact not anxious or depressed appearing.  CNS: CN 2-12  intact, power,  normal throughout.no focal deficits noted.   Assessment & Plan  Vitamin D  deficiency Updated lab needed   Screening for colon cancer Cologuard ordered , asymptomatic and  avg  risk of colon cancer  Obesity (BMI 30.0-34.9)  Patient re-educated about  the importance of commitment to a  minimum of 150 minutes of exercise per week as able.  The importance of healthy food choices with portion control discussed, as well as eating regularly and within a 12 hour window most days. The need to choose clean , green food 50 to 75% of the time is discussed, as well as to make water the primary drink and set a goal of 64 ounces water daily.       12/21/2024    3:28 PM 02/06/2024   11:13 AM 10/14/2023    9:06 AM  Weight /BMI  Weight 198 lb 6.4 oz 195 lb 191 lb 1.9 oz  Height 5' 5 (1.651 m) 5' 5 (1.651 m) 5' 5 (1.651 m)  BMI 33.02 kg/m2 32.45 kg/m2 31.8 kg/m2    Deteriorated needs to work  on lifestyle  Immunization due After obtaining informed consent, the pneumonia  vaccine is  administered , with no adverse effect noted at the time of administration.   Hyperlipidemia LDL goal <100 Hyperlipidemia:Low fat diet discussed and encouraged.   Lipid Panel  Lab Results  Component Value Date   CHOL 199 10/14/2023   HDL 66 10/14/2023  LDLCALC 124 (H) 10/14/2023   TRIG 46 10/14/2023   CHOLHDL 3.0 10/14/2023     Needs to reduce fried and fatty foods  Essential hypertension Controlled, no change in medication DASH diet and commitment to daily physical activity for a minimum of 30 minutes discussed and encouraged, as a part of hypertension management. The importance of attaining a healthy weight is also discussed.     12/21/2024    3:28 PM 02/06/2024   11:13 AM 10/14/2023    9:06 AM 02/18/2023    1:52 PM 02/18/2023    1:34 PM 09/12/2022    9:55 AM 09/12/2022    9:27 AM  BP/Weight  Systolic BP 124 99 119 143 143 130 118  Diastolic BP 78 65 73 88 88 82 73  Wt.  (Lbs) 198.4 195 191.12  191.5  188.08  BMI 33.02 kg/m2 32.45 kg/m2 31.8 kg/m2  31.87 kg/m2  31.3 kg/m2       Audible heartbeat in right ear Refer for carotid doppler  ASCUS of cervix with negative high risk HPV Managed by gyne  Abnormal TSH Rept lab if abnormal with manage with low dose supplement , appears to have sub clinical hypothyroidism  "

## 2024-12-22 NOTE — Assessment & Plan Note (Signed)
 Hyperlipidemia:Low fat diet discussed and encouraged.   Lipid Panel  Lab Results  Component Value Date   CHOL 199 10/14/2023   HDL 66 10/14/2023   LDLCALC 124 (H) 10/14/2023   TRIG 46 10/14/2023   CHOLHDL 3.0 10/14/2023     Needs to reduce fried and fatty foods

## 2024-12-22 NOTE — Assessment & Plan Note (Addendum)
 After obtaining informed consent, the  pneumonia vaccine is  administered , with no adverse effect noted at the time of administration.

## 2024-12-22 NOTE — Assessment & Plan Note (Signed)
Refer for carotid doppler 

## 2024-12-22 NOTE — Assessment & Plan Note (Signed)
" °  Patient re-educated about  the importance of commitment to a  minimum of 150 minutes of exercise per week as able.  The importance of healthy food choices with portion control discussed, as well as eating regularly and within a 12 hour window most days. The need to choose clean , green food 50 to 75% of the time is discussed, as well as to make water the primary drink and set a goal of 64 ounces water daily.       12/21/2024    3:28 PM 02/06/2024   11:13 AM 10/14/2023    9:06 AM  Weight /BMI  Weight 198 lb 6.4 oz 195 lb 191 lb 1.9 oz  Height 5' 5 (1.651 m) 5' 5 (1.651 m) 5' 5 (1.651 m)  BMI 33.02 kg/m2 32.45 kg/m2 31.8 kg/m2    Deteriorated needs to work  on lifestyle "

## 2024-12-22 NOTE — Assessment & Plan Note (Signed)
 Cologuard ordered , asymptomatic and  avg  risk of colon cancer

## 2024-12-22 NOTE — Assessment & Plan Note (Signed)
 Controlled, no change in medication DASH diet and commitment to daily physical activity for a minimum of 30 minutes discussed and encouraged, as a part of hypertension management. The importance of attaining a healthy weight is also discussed.     12/21/2024    3:28 PM 02/06/2024   11:13 AM 10/14/2023    9:06 AM 02/18/2023    1:52 PM 02/18/2023    1:34 PM 09/12/2022    9:55 AM 09/12/2022    9:27 AM  BP/Weight  Systolic BP 124 99 119 143 143 130 118  Diastolic BP 78 65 73 88 88 82 73  Wt. (Lbs) 198.4 195 191.12  191.5  188.08  BMI 33.02 kg/m2 32.45 kg/m2 31.8 kg/m2  31.87 kg/m2  31.3 kg/m2

## 2024-12-22 NOTE — Assessment & Plan Note (Signed)
Managed by gyne ?

## 2024-12-22 NOTE — Assessment & Plan Note (Signed)
 Rept lab if abnormal with manage with low dose supplement , appears to have sub clinical hypothyroidism

## 2024-12-22 NOTE — Assessment & Plan Note (Signed)
 Updated lab needed.

## 2025-07-14 ENCOUNTER — Encounter: Payer: Self-pay | Admitting: Family Medicine
